# Patient Record
Sex: Female | Born: 1995
Health system: Southern US, Community
[De-identification: ages and names within clinical notes are randomized; demographics above are authoritative.]

## PROBLEM LIST (undated history)

## (undated) DIAGNOSIS — R011 Cardiac murmur, unspecified: Secondary | ICD-10-CM

## (undated) DIAGNOSIS — F419 Anxiety disorder, unspecified: Secondary | ICD-10-CM

## (undated) HISTORY — DX: Anxiety disorder, unspecified: F41.9

## (undated) HISTORY — DX: Cardiac murmur, unspecified: R01.1

---
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
## 2013-09-16 ENCOUNTER — Encounter (HOSPITAL_COMMUNITY): Payer: Self-pay | Admitting: Emergency Medicine

## 2013-09-16 ENCOUNTER — Emergency Department (INDEPENDENT_AMBULATORY_CARE_PROVIDER_SITE_OTHER)
Admission: EM | Admit: 2013-09-16 | Discharge: 2013-09-16 | Disposition: A | Discharge status: Home / Self Care | Payer: Self-pay | Source: Home / Self Care | Attending: Family Medicine | Admitting: Family Medicine

## 2013-09-16 DIAGNOSIS — L259 Unspecified contact dermatitis, unspecified cause: Secondary | ICD-10-CM

## 2013-09-16 MED ORDER — PREDNISONE 5 MG PO KIT: PACK | ORAL | Status: DC

## 2013-09-16 MED ORDER — TRIAMCINOLONE ACETONIDE 0.1 % EX CREA: 1.0000 "application " | TOPICAL_CREAM | Freq: Two times a day (BID) | CUTANEOUS | Status: DC

## 2013-09-16 NOTE — Discharge Instructions (Signed)
Thank you for coming in today. Take prednisone as directed. Use triamcinolone cream for the very bad spots. Use Gold Bond Itch as needed. Take Benadryl as needed at bedtime.

## 2013-09-16 NOTE — ED Provider Notes (Signed)
Charlene Reed is a 18 y.o. female who presents to Urgent Care today for rash on trunk and extremities for the past 2 days. The rash has spread spontaneously. It is itchy. She denies any fevers chills nausea vomiting diarrhea. She denies any cutaneous involvement. She denies any new medications. She's tried over-the-counter hydrocortisone cream which has not helped much. She feels well otherwise. She denies any new soaps conditioners detergents etc.   History reviewed. No pertinent past medical history. History  Substance Use Topics  . Smoking status: Never Smoker   . Smokeless tobacco: Not on file  . Alcohol Use: No   ROS as above Medications: No current facility-administered medications for this encounter.   Current Outpatient Prescriptions  Medication Sig Dispense Refill  . PredniSONE 5 MG KIT 12 day dose pack po  1 kit  0  . triamcinolone cream (KENALOG) 0.1 % Apply 1 application topically 2 (two) times daily.  60 g  1    Exam:  BP 126/58  Pulse 62  Temp(Src) 98.1 F (36.7 C) (Oral)  Resp 18  SpO2 100%  LMP 08/15/2013 Gen: Well NAD HEENT: EOMI,  MMM no mucocutaneous involvement Lungs: Normal work of breathing. CTABL Heart: RRR no MRG Abd: NABS, Soft. NT, ND Exts: Brisk capillary refill, warm and well perfused.  Skin: Scattered maculopapular erythema across trunk and extremities. Confluent 1 cm diameter circles  Assessment and Plan: 18 y.o. female with contact dermatitis most likely explanation. Plan to treat with prednisone Dosepak, triamcinolone cream and Gold Bond Itch. Additionally recommend Benadryl. Followup if not improving.  Discussed warning signs or symptoms. Please see discharge instructions. Patient expresses understanding.    Gregor Hams, MD 09/16/13 [phone]

## 2013-09-16 NOTE — ED Notes (Signed)
C/o rash all over body since yesterday Admit the rash does itch States she has never had this reaction before Has put lotion on skin States rash started off small

## 2014-02-11 IMAGING — CR DG CHEST 2V
2 series · 2 of 2 positions shown · non-contrast
Comparison: None.

CLINICAL DATA: Vertigo with syncope.

CHEST - 2 VIEW

[view not recorded (1 of 2)]
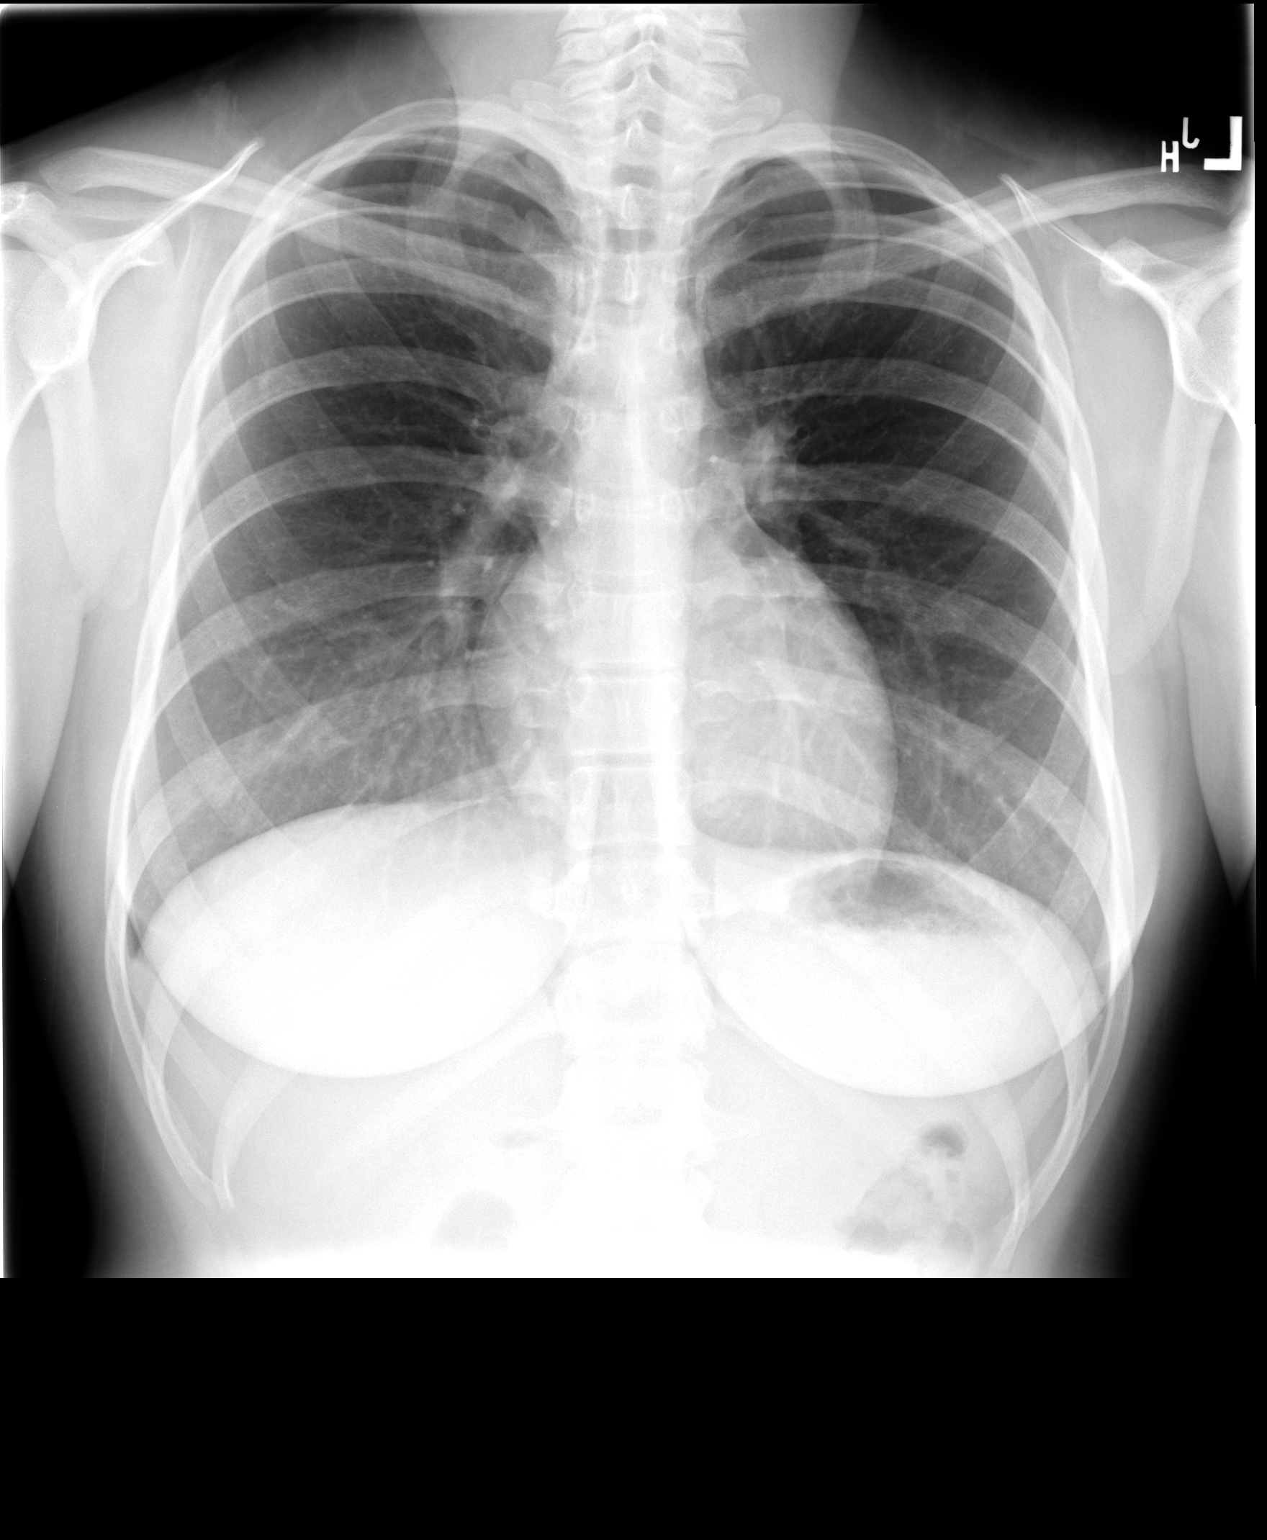

[view not recorded (2 of 2)]
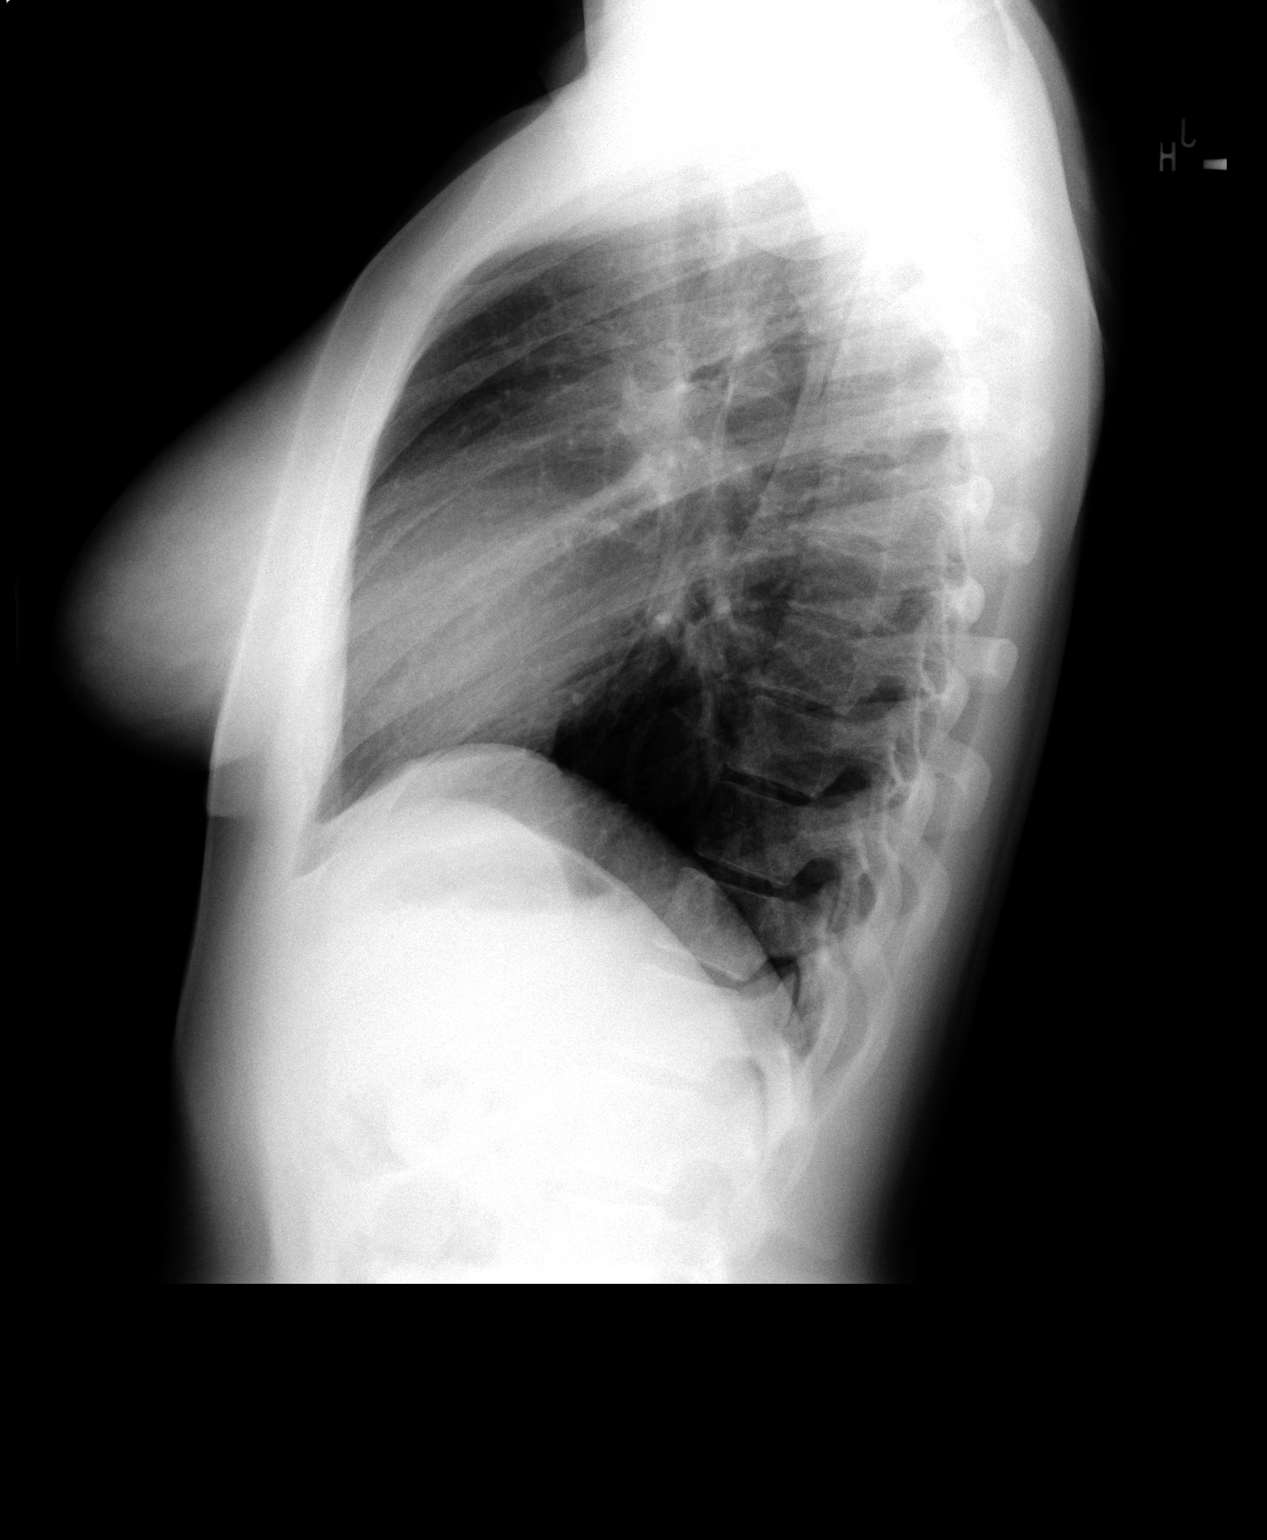

[2 of 2 positions shown; findings below may reference images not displayed]

FINDINGS: Midline trachea.  Normal heart size and mediastinal
contours. No pleural effusion or pneumothorax.  Clear lungs.
IMPRESSION: Normal chest.

## 2016-08-09 LAB — HM PAP SMEAR: HM PAP: NEGATIVE

## 2018-05-25 ENCOUNTER — Ambulatory Visit: Payer: Self-pay | Admitting: Osteopathic Medicine

## 2018-06-11 ENCOUNTER — Ambulatory Visit (INDEPENDENT_AMBULATORY_CARE_PROVIDER_SITE_OTHER): Payer: No Typology Code available for payment source | Admitting: Osteopathic Medicine

## 2018-06-11 ENCOUNTER — Encounter: Payer: Self-pay | Admitting: Osteopathic Medicine

## 2018-06-11 DIAGNOSIS — Z113 Encounter for screening for infections with a predominantly sexual mode of transmission: Secondary | ICD-10-CM

## 2018-06-11 DIAGNOSIS — Z30011 Encounter for initial prescription of contraceptive pills: Secondary | ICD-10-CM | POA: Diagnosis not present

## 2018-06-11 DIAGNOSIS — L700 Acne vulgaris: Secondary | ICD-10-CM | POA: Diagnosis not present

## 2018-06-11 DIAGNOSIS — F418 Other specified anxiety disorders: Secondary | ICD-10-CM

## 2018-06-11 LAB — POCT URINE PREGNANCY: Preg Test, Ur: NEGATIVE

## 2018-06-11 MED ORDER — ESCITALOPRAM OXALATE 5 MG PO TABS: 5.0000 mg | ORAL_TABLET | Freq: Every day | ORAL | 0 refills | Status: DC

## 2018-06-11 MED ORDER — ETONOGESTREL-ETHINYL ESTRADIOL 0.12-0.015 MG/24HR VA RING: VAGINAL_RING | VAGINAL | 3 refills | Status: DC

## 2018-06-11 MED FILL — ESCITALOPRAM 5 MG TABLET: 5 | 90 days supply | Qty: 90 | Fill #0

## 2018-06-11 NOTE — Progress Notes (Signed)
HPI: Charlene Reed is a 22 y.o. female who  has no past medical history on file.  she presents to [facility] today, 06/11/18,  for chief complaint of: New to establish care Contraception Anxiety Needs paperwork for school  Very pleasant new patient here to establish care.  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  Has some questions about medications/management of anxiety.  She frequently feels overwhelmed by her work duties, some difficulty sleeping on occasion.  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  Previously on oral contraceptives for birth control as well as acne.  Is interested in getting back on pills but also has some questions about the NuvaRing.  Needs some paperwork filled out in the next couple of weeks to verify immunization history, TB screening, hemoglobin.  Does not have lab results with her, she states she thinks employee health had some blood work done not too long ago, unfortunately we cannot see these results in epic.  Previously seeing a dermatologist for acne management and would like a referral to someone in network since she has changed insurance   Past medical, surgical, social and family history reviewed:  Patient Active Problem List   Diagnosis Date Noted  . Acne vulgaris 06/11/2018   History reviewed. No pertinent surgical history.  Social History   Tobacco Use  . Smoking status: Current Every Day Smoker  . Smokeless tobacco: Never Used  Substance Use Topics  . Alcohol use: No    Family History  Problem Relation Age of Onset  . Heart attack Father   . High blood pressure Father   . Breast cancer Maternal Grandmother      Current medication list and allergy/intolerance information reviewed:   No Known Allergies    Review of Systems:  Constitutional:  No  fever, no chills, No recent illness, No unintentional weight  changes. No significant fatigue.   HEENT: No  headache, no vision change, no hearing change, No sore throat, No  sinus pressure  Cardiac: No  chest pain, No  pressure, No palpitations, No  Orthopnea  Respiratory:  No  shortness of breath. No  Cough  Gastrointestinal: No  abdominal pain, No  nausea, No  vomiting,  No  blood in stool, No  diarrhea, No  constipation   Musculoskeletal: No new myalgia/arthralgia  Skin: No  Rash, No other wounds/concerning lesions  Genitourinary: No  incontinence, No  abnormal genital bleeding, No abnormal genital discharge  Hem/Onc: No  easy bruising/bleeding, No  abnormal lymph node  Endocrine: No cold intolerance,  No heat intolerance. No polyuria/polydipsia/polyphagia   Neurologic: No  weakness, No  dizziness, No  slurred speech/focal weakness/facial droop  Psychiatric: +concerns with depression, +concerns with anxiety, No sleep problems, No mood problems  Exam:  BP 125/74 (BP Location: Left Arm, Patient Position: Sitting, Cuff Size: Normal)   Pulse 74   Temp 98.4 F (36.9 C) (Oral)   Wt 141 lb 3.2 oz (64 kg)   Constitutional: VS see above. General Appearance: alert, well-developed, well-nourished, NAD  Eyes: Normal lids and conjunctive, non-icteric sclera  Ears, Nose, Mouth, Throat: MMM, Normal external inspection ears/nares/mouth/lips/gums. TM normal bilaterally. Pharynx/tonsils no erythema, no exudate. Nasal mucosa normal.   Neck: No masses, trachea midline. No thyroid enlargement. No tenderness/mass appreciated. No lymphadenopathy  Respiratory: Normal respiratory effort. no wheeze, no rhonchi, no rales  Cardiovascular: S1/S2 normal, no murmur, no rub/gallop  auscultated. RRR.   Gastrointestinal: Nontender, no masses. No hepatomegaly, no splenomegaly. No hernia appreciated. Bowel sounds normal. Rectal exam deferred.   Musculoskeletal: Gait normal. No clubbing/cyanosis of digits.   Neurological: Normal balance/coordination. No tremor.    Skin: warm, dry, intact. No rash/ulcer. No concerning nevi or subq nodules on limited exam.    Psychiatric: Normal judgment/insight. Normal mood and affect. Oriented x3.     ASSESSMENT/PLAN:   Mixed anxiety depressive disorder - Plan: CBC, QuantiFERON-TB Gold Plus, TSH, COMPLETE METABOLIC PANEL WITH GFR  Routine screening for STI (sexually transmitted infection) - Plan: C. trachomatis/N. gonorrhoeae RNA, Trichomonas vaginalis, RNA, POCT Pregnancy, Urine, CBC, QuantiFERON-TB Gold Plus, TSH, COMPLETE METABOLIC PANEL WITH GFR, Hepatitis C antibody, reflex, RPR, HIV Antibody (routine testing w rflx)  Encounter for initial prescription of contraceptive pills - Plan: C. trachomatis/N. gonorrhoeae RNA, Trichomonas vaginalis, RNA, POCT Pregnancy, Urine, CBC, QuantiFERON-TB Gold Plus, TSH, COMPLETE METABOLIC PANEL WITH GFR  Acne vulgaris - Plan: Ambulatory referral to Dermatology    We will see if we can get lab records from employee health to complete paperwork, if not can reorder.  [employer] is down at the moment, will try again Monday.  It was given originals and we have a copy of the paperwork that she needs to have filled out.  Visit summary with medication list and pertinent instructions was printed for patient to review. All questions at time of visit were answered - patient instructed to contact office with any additional concerns. ER/RTC precautions were reviewed with the patient.   Follow-up plan: Return in about 6 weeks (around 07/23/2018) for moos recheck +/- annual wellness physical .    Please note: voice recognition software was used to produce this document, and typos may escape review. Please contact Dr. Lyn Hollingshead for any needed clarifications.

## 2018-06-12 LAB — C. TRACHOMATIS/N. GONORRHOEAE RNA
C. TRACHOMATIS RNA, TMA: NOT DETECTED
N. gonorrhoeae RNA, TMA: NOT DETECTED

## 2018-06-13 LAB — QUANTIFERON-TB GOLD PLUS
Mitogen-NIL: >10 IU/mL
NIL: 0.07 IU/mL
QuantiFERON-TB Gold Plus: NEGATIVE
TB2-NIL: <0 IU/mL

## 2018-06-13 LAB — COMPLETE METABOLIC PANEL WITH GFR
AG Ratio: 1.8 (calc) (ref 1.0–2.5)
ALKALINE PHOSPHATASE (APISO): 55 U/L (ref 33–115)
ALT: 10 U/L (ref 6–29)
AST: 16 U/L (ref 10–30)
Albumin: 4.5 g/dL (ref 3.6–5.1)
BUN: 8 mg/dL (ref 7–25)
CO2: 27 mmol/L (ref 20–32)
CREATININE: 0.77 mg/dL (ref 0.50–1.10)
Calcium: 10 mg/dL (ref 8.6–10.2)
Chloride: 105 mmol/L (ref 98–110)
GFR, Est African American: 125 mL/min/{1.73_m2} (ref >=60)
GFR, Est Non African American: 108 mL/min/{1.73_m2} (ref >=60)
GLUCOSE: 86 mg/dL (ref 65–99)
Globulin: 2.5 g/dL (calc) (ref 1.9–3.7)
POTASSIUM: 4.2 mmol/L (ref 3.5–5.3)
Sodium: 139 mmol/L (ref 135–146)
Total Bilirubin: 0.3 mg/dL (ref 0.2–1.2)
Total Protein: 7 g/dL (ref 6.1–8.1)

## 2018-06-13 LAB — CBC
HCT: 41.3 % (ref 35.0–45.0)
Hemoglobin: 14 g/dL (ref 11.7–15.5)
MCH: 30.2 pg (ref 27.0–33.0)
MCHC: 33.9 g/dL (ref 32.0–36.0)
MCV: 89 fL (ref 80.0–100.0)
MPV: 10.7 fL (ref 7.5–12.5)
PLATELETS: 355 10*3/uL (ref 140–400)
RBC: 4.64 10*6/uL (ref 3.80–5.10)
RDW: 12 % (ref 11.0–15.0)
WBC: 5.8 10*3/uL (ref 3.8–10.8)

## 2018-06-13 LAB — TSH: TSH: 1.62 mIU/L

## 2018-06-14 LAB — RPR: RPR Ser Ql: NONREACTIVE

## 2018-06-14 LAB — HIV ANTIBODY (ROUTINE TESTING W REFLEX): HIV 1&2 Ab, 4th Generation: NONREACTIVE

## 2018-06-17 ENCOUNTER — Encounter: Payer: Self-pay | Admitting: Osteopathic Medicine

## 2018-06-24 ENCOUNTER — Encounter: Payer: Self-pay | Admitting: Osteopathic Medicine

## 2018-07-02 MED FILL — NUVARING VAGINAL RING: 0.12-0.015 | 84 days supply | Qty: 3 | Fill #0

## 2018-07-23 ENCOUNTER — Encounter: Payer: No Typology Code available for payment source | Admitting: Osteopathic Medicine

## 2018-09-10 MED FILL — ETONOGESTREL-ETHINYL ESTRAD: 0.12-0.015 | 84 days supply | Qty: 3 | Fill #1

## 2018-11-09 ENCOUNTER — Telehealth: Payer: Self-pay

## 2018-11-09 NOTE — Telephone Encounter (Signed)
Charlene Reed called and left a message about birth control options. I called and left her a message to schedule an appointment.

## 2018-11-10 NOTE — Telephone Encounter (Signed)
Can talk about that at her physical If contraception needed in the interim, recommend condoms/abstinence

## 2018-11-10 NOTE — Telephone Encounter (Signed)
Pt has a physical scheduled on 11/15/18.Charlene Reed is this something you can discuss then or does it require a seperate appt?

## 2018-11-10 NOTE — Telephone Encounter (Signed)
Left pt msg advising medication needs can be addressed at physical.

## 2018-11-15 ENCOUNTER — Encounter: Payer: No Typology Code available for payment source | Admitting: Osteopathic Medicine

## 2018-11-16 MED FILL — ETONOGESTREL-ETHINYL ESTRAD: 0.12-0.015 | 84 days supply | Qty: 3 | Fill #2

## 2018-11-16 MED FILL — FLUCONAZOLE 150 MG TABS: 150 | 5 days supply | Qty: 2 | Fill #0

## 2018-11-29 ENCOUNTER — Telehealth: Payer: Self-pay

## 2018-11-29 NOTE — Telephone Encounter (Signed)
Charlene Reed called and left a message stating she will need an appointment for fever and cough. I called and left a message stating patient will need to call back to schedule appointment. She will also need to reach out to [facility] - [phone].

## 2019-01-25 ENCOUNTER — Encounter: Payer: No Typology Code available for payment source | Admitting: Osteopathic Medicine

## 2019-04-19 ENCOUNTER — Other Ambulatory Visit: Payer: Self-pay | Admitting: Osteopathic Medicine

## 2019-04-19 MED FILL — ETONOGESTREL-ETHINYL ESTRAD: 0.12-0.015 | 84 days supply | Qty: 3 | Fill #0

## 2019-04-26 ENCOUNTER — Encounter: Payer: Self-pay | Admitting: Osteopathic Medicine

## 2019-04-26 ENCOUNTER — Other Ambulatory Visit: Payer: Self-pay

## 2019-04-26 ENCOUNTER — Ambulatory Visit (INDEPENDENT_AMBULATORY_CARE_PROVIDER_SITE_OTHER): Payer: No Typology Code available for payment source | Admitting: Osteopathic Medicine

## 2019-04-26 VITALS — BP 104/70 | HR 61 | Temp 98.1°F | Wt 145.4 lb

## 2019-04-26 DIAGNOSIS — Z0000 Encounter for general adult medical examination without abnormal findings: Secondary | ICD-10-CM

## 2019-04-26 DIAGNOSIS — Q278 Other specified congenital malformations of peripheral vascular system: Secondary | ICD-10-CM

## 2019-04-26 MED ORDER — ETONOGESTREL-ETHINYL ESTRADIOL 0.12-0.015 MG/24HR VA RING: VAGINAL_RING | VAGINAL | 4 refills | Status: DC

## 2019-04-26 NOTE — Progress Notes (Signed)
HPI: Charlene Reed is a 23 y.o. female who  has no past medical history on file.  she presents to [facility] today, 04/26/19,  for chief complaint of: Annual Physical     Patient here for annual physical / wellness exam.  See preventive care reviewed as below.    Additional concerns today include:  Requests referral to vein specialist.  She reports some discoloration and bulging veins in lower extremities especially when on her feet all day.  She is interested in possible cosmetic solutions to this.    Past medical, surgical, social and family history reviewed:  Patient Active Problem List   Diagnosis Date Noted  . Acne vulgaris 06/11/2018    History reviewed. No pertinent surgical history.  Social History   Tobacco Use  . Smoking status: Never Smoker  . Smokeless tobacco: Never Used  Substance Use Topics  . Alcohol use: No    Family History  Problem Relation Age of Onset  . Heart attack Father   . High blood pressure Father   . Breast cancer Maternal Grandmother      Current medication list and allergy/intolerance information reviewed:    Current Outpatient Medications  Medication Sig Dispense Refill  . etonogestrel-ethinyl estradiol (NUVARING) 0.12-0.015 MG/24HR vaginal ring INSERT 1 RING VAGINALLY AND LEAVE IN PLACE FOR 3 WEEKS THEN REMOVE FOR 1 WEEK AND PLACE NEW RING 3 each 4   No current facility-administered medications for this visit.     No Known Allergies    Review of Systems:  Constitutional:  No  fever, no chills, No recent illness, No unintentional weight changes. No significant fatigue.   HEENT: No  headache, no vision change, no hearing change, No sore throat, No  sinus pressure  Cardiac: No  chest pain, No  pressure, No palpitations, No  Orthopnea  Respiratory:  No  shortness of breath. No  Cough  Gastrointestinal: No  abdominal pain, No  nausea, No  vomiting,  No  blood in stool, No  diarrhea, No   constipation   Musculoskeletal: No new myalgia/arthralgia  Skin: No  Rash, No other wounds/concerning lesions  Genitourinary: No  incontinence, No  abnormal genital bleeding, No abnormal genital discharge  Hem/Onc: No  easy bruising/bleeding, No  abnormal lymph node  Endocrine: No cold intolerance,  No heat intolerance. No polyuria/polydipsia/polyphagia   Neurologic: No  weakness, No  dizziness, No  slurred speech/focal weakness/facial droop  Psychiatric: No  concerns with depression, No  concerns with anxiety, No sleep problems, No mood problems  Exam:  BP 104/70 (BP Location: Left Arm, Patient Position: Sitting, Cuff Size: Normal)   Pulse 61   Temp 98.1 F (36.7 C) (Oral)   Wt 145 lb 6.4 oz (66 kg)   Constitutional: VS see above. General Appearance: alert, well-developed, well-nourished, NAD  Eyes: Normal lids and conjunctive, non-icteric sclera  Ears, Nose, Mouth, Throat: MMM, Normal external inspection ears/nares/mouth/lips/gums. TM normal bilaterally. Pharynx/tonsils no erythema, no exudate. Nasal mucosa normal.   Neck: No masses, trachea midline. No thyroid enlargement. No tenderness/mass appreciated. No lymphadenopathy  Respiratory: Normal respiratory effort. no wheeze, no rhonchi, no rales  Cardiovascular: S1/S2 normal, no murmur, no rub/gallop auscultated. RRR. No lower extremity edema. Veins appear prominent but normal   Gastrointestinal: Nontender, no masses. No hepatomegaly, no splenomegaly. No hernia appreciated. Bowel sounds normal. Rectal exam deferred.   Musculoskeletal: Gait normal. No clubbing/cyanosis of digits.   Neurological: Normal balance/coordination. No tremor. No cranial nerve deficit on  limited exam. Motor and sensation intact and symmetric. Cerebellar reflexes intact.   Skin: warm, dry, intact. No rash/ulcer. No concerning nevi or subq nodules on limited exam.    Psychiatric: Normal judgment/insight. Normal mood and affect. Oriented x3.    No  results found for this or any previous visit (from the past 72 hour(s)).  No results found.   ASSESSMENT/PLAN: The primary encounter diagnosis was Annual physical exam. A diagnosis of Abnormality of systemic vein was also pertinent to this visit.   Orders Placed This Encounter  Procedures  . Ambulatory referral to Vascular Surgery    Meds ordered this encounter  Medications  . etonogestrel-ethinyl estradiol (NUVARING) 0.12-0.015 MG/24HR vaginal ring    Sig: INSERT 1 RING VAGINALLY AND LEAVE IN PLACE FOR 3 WEEKS THEN REMOVE FOR 1 WEEK AND PLACE NEW RING    Dispense:  3 each    Refill:  4    Patient Instructions   General Preventive Care  Most recent routine screening lipids/other labs: every other year is fine for people under 35 who are otherwise healthy. We should check cholesterol every 2 years given family history.   Everyone should have blood pressure checked once per year.   Tobacco: don't!   Alcohol: responsible moderation is ok for most adults - if you have concerns about your alcohol intake, please talk to me!   Exercise: as tolerated to reduce risk of cardiovascular disease and diabetes. Strength training will also prevent osteoporosis.   Mental health: if need for mental health care (medicines, counseling, other), or concerns about moods, please let me know!   Sexual health: if need for STD testing, or if concerns with libido/pain problems, please let me know! If you need to discuss your birth control options, please let me know!   Advanced Directive: Living Will and/or Healthcare Power of Attorney recommended for all adults, regardless of age or health.  Vaccines  Flu vaccine: recommended for almost everyone, every fall.   Tetanus booster: Tdap recommended every 10 years. Due 2027  HPV vaccine: done!   See below for vaccine records we have on file  Cancer screenings   Colon cancer screening: recommended for everyone at age 69, but some folks need a  colonoscopy sooner if risk factors   Breast cancer screening: mammogram recommended at age 24  Cervical cancer screening: Pap due 08/2019   Lung cancer screening: not needed for non-smokers  Infection screenings . HIV, Gonorrhea/Chlamydia, other STI: screening as needed . TB: certain at-risk populations, or depending on work requirements and/or travel history Other . Bone Density Test: recommended for women at age 20             Immunization History  Administered Date(s) Administered  . DTaP 05/12/1994, 07/17/1994, 09/18/1994, 09/24/1995, 03/27/1998  . HPV Quadrivalent 03/11/2006, 05/13/2006, 08/03/2007  . Hepatitis A, Ped/Adol-2 Dose 03/11/2006, 08/03/2007  . Hepatitis B, ped/adol 05/12/1994, 07/17/1994, 02/26/1995  . HiB (PRP-OMP) 05/12/1994, 07/17/1994, 09/18/1994, 09/24/1995  . IPV 05/12/1994, 07/17/1994, 09/18/1994, 03/27/1998  . Influenza, Seasonal, Injecte, Preservative Fre 10/07/2012, 06/06/2016, 06/16/2017  . Influenza-Unspecified 05/02/2018  . MMR 05/21/1995, 03/27/1998  . Meningococcal Conjugate 10/07/2012  . PPD Test 09/30/2016  . Tdap 03/11/2006, 01/11/2016          Visit summary with medication list and pertinent instructions was printed for patient to review. All questions at time of visit were answered - patient instructed to contact office with any additional concerns or updates. ER/RTC precautions were reviewed with the patient.  Please note: voice recognition software was used to produce this document, and typos may escape review. Please contact Dr. Sheppard Coil for any needed clarifications.     Follow-up plan: Return for PAP ONLY 08/2019. Marland Kitchen

## 2019-04-26 NOTE — Patient Instructions (Signed)
General Preventive Care  Most recent routine screening lipids/other labs: every other year is fine for people under 35 who are otherwise healthy. We should check cholesterol every 2 years given family history.   Everyone should have blood pressure checked once per year.   Tobacco: don't!   Alcohol: responsible moderation is ok for most adults - if you have concerns about your alcohol intake, please talk to me!   Exercise: as tolerated to reduce risk of cardiovascular disease and diabetes. Strength training will also prevent osteoporosis.   Mental health: if need for mental health care (medicines, counseling, other), or concerns about moods, please let me know!   Sexual health: if need for STD testing, or if concerns with libido/pain problems, please let me know! If you need to discuss your birth control options, please let me know!   Advanced Directive: Living Will and/or Healthcare Power of Attorney recommended for all adults, regardless of age or health.  Vaccines  Flu vaccine: recommended for almost everyone, every fall.   Tetanus booster: Tdap recommended every 10 years. Due 2027  HPV vaccine: done!   See below for vaccine records we have on file  Cancer screenings   Colon cancer screening: recommended for everyone at age 24, but some folks need a colonoscopy sooner if risk factors   Breast cancer screening: mammogram recommended at age 13  Cervical cancer screening: Pap due 08/2019   Lung cancer screening: not needed for non-smokers  Infection screenings . HIV, Gonorrhea/Chlamydia, other STI: screening as needed . TB: certain at-risk populations, or depending on work requirements and/or travel history Other . Bone Density Test: recommended for women at age 62             Immunization History  Administered Date(s) Administered  . DTaP 05/12/1994, 07/17/1994, 09/18/1994, 09/24/1995, 03/27/1998  . HPV Quadrivalent 03/11/2006, 05/13/2006, 08/03/2007  .  Hepatitis A, Ped/Adol-2 Dose 03/11/2006, 08/03/2007  . Hepatitis B, ped/adol 05/12/1994, 07/17/1994, 02/26/1995  . HiB (PRP-OMP) 05/12/1994, 07/17/1994, 09/18/1994, 09/24/1995  . IPV 05/12/1994, 07/17/1994, 09/18/1994, 03/27/1998  . Influenza, Seasonal, Injecte, Preservative Fre 10/07/2012, 06/06/2016, 06/16/2017  . Influenza-Unspecified 05/02/2018  . MMR 05/21/1995, 03/27/1998  . Meningococcal Conjugate 10/07/2012  . PPD Test 09/30/2016  . Tdap 03/11/2006, 01/11/2016

## 2019-06-29 ENCOUNTER — Other Ambulatory Visit: Payer: Self-pay

## 2019-06-29 DIAGNOSIS — I83899 Varicose veins of unspecified lower extremities with other complications: Secondary | ICD-10-CM

## 2019-07-07 ENCOUNTER — Encounter: Payer: No Typology Code available for payment source | Admitting: Vascular Surgery

## 2019-07-07 ENCOUNTER — Encounter (HOSPITAL_COMMUNITY): Payer: No Typology Code available for payment source

## 2019-08-31 ENCOUNTER — Ambulatory Visit: Payer: No Typology Code available for payment source | Admitting: Osteopathic Medicine

## 2019-10-04 ENCOUNTER — Encounter: Payer: No Typology Code available for payment source | Admitting: Vascular Surgery

## 2019-10-04 ENCOUNTER — Encounter (HOSPITAL_COMMUNITY): Payer: No Typology Code available for payment source

## 2019-10-20 ENCOUNTER — Encounter: Payer: No Typology Code available for payment source | Admitting: Vascular Surgery

## 2019-10-20 ENCOUNTER — Encounter (HOSPITAL_COMMUNITY): Payer: No Typology Code available for payment source

## 2019-12-06 ENCOUNTER — Ambulatory Visit: Payer: No Typology Code available for payment source | Admitting: Osteopathic Medicine

## 2019-12-06 ENCOUNTER — Telehealth: Payer: Self-pay | Admitting: Osteopathic Medicine

## 2019-12-06 NOTE — Telephone Encounter (Signed)
NO SHOW 08/31/19 for Pap w/ NMA NO SHOW 12/06/19 for Pap w/ NMA  Please call patient: she did not show to her appointment today. This is her second no-show in the past year. Please call to make sure everything is ok and see if she would like to reschedule. Please remind her of clinic policy that 3 no-shows in 1 year will result in dismissal from the practice, to protect access to appointments for all our patients.

## 2019-12-06 NOTE — Telephone Encounter (Signed)
Left patient a voicemail with information below. Let patient know to call us back with any questions.  

## 2020-07-09 ENCOUNTER — Telehealth: Payer: Self-pay | Admitting: Osteopathic Medicine

## 2020-07-09 ENCOUNTER — Ambulatory Visit: Payer: No Typology Code available for payment source | Admitting: Osteopathic Medicine

## 2020-07-09 NOTE — Telephone Encounter (Signed)
If you place the dismissal letter in [facility] mailbox in triage, she will send it certified mail for you. Thank you

## 2020-07-09 NOTE — Telephone Encounter (Signed)
Dismissal letter sent FYI to practice admin, clinical/front team leads

## 2020-07-24 NOTE — Telephone Encounter (Signed)
Yes I'm aware, I just need it flagged in the system and I can't do that Thanks!

## 2021-08-14 ENCOUNTER — Ambulatory Visit: Payer: Self-pay

## 2021-09-08 ENCOUNTER — Ambulatory Visit: Payer: Self-pay

## 2022-01-09 ENCOUNTER — Encounter: Payer: Self-pay | Admitting: Family Medicine

## 2022-01-09 ENCOUNTER — Other Ambulatory Visit (HOSPITAL_COMMUNITY)
Admission: RE | Admit: 2022-01-09 | Discharge: 2022-01-09 | Disposition: A | Discharge status: Home / Self Care | Payer: No Typology Code available for payment source | Source: Ambulatory Visit | Attending: Family Medicine | Admitting: Family Medicine

## 2022-01-09 ENCOUNTER — Ambulatory Visit (INDEPENDENT_AMBULATORY_CARE_PROVIDER_SITE_OTHER): Payer: No Typology Code available for payment source | Admitting: Family Medicine

## 2022-01-09 VITALS — BP 111/78 | HR 70 | Ht 60.0 in | Wt 151.2 lb

## 2022-01-09 DIAGNOSIS — Z1159 Encounter for screening for other viral diseases: Secondary | ICD-10-CM | POA: Diagnosis not present

## 2022-01-09 DIAGNOSIS — Z124 Encounter for screening for malignant neoplasm of cervix: Secondary | ICD-10-CM | POA: Diagnosis not present

## 2022-01-09 DIAGNOSIS — A749 Chlamydial infection, unspecified: Secondary | ICD-10-CM

## 2022-01-09 DIAGNOSIS — R87612 Low grade squamous intraepithelial lesion on cytologic smear of cervix (LGSIL): Secondary | ICD-10-CM

## 2022-01-09 DIAGNOSIS — Z113 Encounter for screening for infections with a predominantly sexual mode of transmission: Secondary | ICD-10-CM

## 2022-01-09 DIAGNOSIS — Z0000 Encounter for general adult medical examination without abnormal findings: Secondary | ICD-10-CM

## 2022-01-09 NOTE — Progress Notes (Signed)
? ?Complete physical exam ? ?Patient: Charlene Reed   DOB: 1996-04-19   26 y.o. Female  MRN: [id number] ? ?Subjective:  ?  ?CC: establish care, no concerns, CPE today  ? ? ?Charlene Reed is a 26 y.o. female who presents today for a complete physical exam. She reports consuming a  general, healthy  diet. Home exercise routine includes cardio 20 minutes 5x/week, weight lifting . She generally feels well. She reports sleeping well. She does not have additional problems to discuss today.  ? 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 ? ? ? ?Most recent fall risk assessment: ? ?  01/09/2022  ?  1:56 PM  ?Fall Risk   ?Falls in the past year? 0  ?Number falls in past yr: 0  ?Injury with Fall? 0  ? ?  ?Most recent depression screenings: ? ?  01/09/2022  ?  1:57 PM 04/26/2019  ?  9:37 AM  ?PHQ 2/9 Scores  ?PHQ - 2 Score 0 0  ?PHQ- 9 Score  3  ? ? ?Vision:Not within last year , Dental: No current dental problems and No regular dental care , and STD: no concerns, but would like routine screening. Scheduled for upcoming eye doctor and dental exams.  ? ?Patient Active Problem List  ? Diagnosis Date Noted  ? Acne vulgaris 06/11/2018  ? ?History reviewed. No pertinent past medical history. ?No Known Allergies ?  ? ?Patient Care Team: ?Terrilyn Saver, NP as PCP - General (Family Medicine)  ? ?Outpatient Medications Prior to Visit  ?Medication Sig  ? Multiple Vitamin (MULTIVITAMIN) tablet Take 1 tablet by mouth daily.  ? [DISCONTINUED] etonogestrel-ethinyl estradiol (NUVARING) 0.12-0.015 MG/24HR vaginal ring INSERT 1 RING VAGINALLY AND LEAVE IN PLACE FOR 3 WEEKS THEN REMOVE FOR 1 WEEK AND PLACE NEW RING  ? ?No facility-administered medications prior to visit.  ? ? ?ROS ?All review of systems negative except what is listed in the HPI ? ? ? ?   ?Objective:  ? ?  ?BP 111/78   Pulse 70   Ht 5' (1.524 m)   Wt 151 lb 3.2 oz (68.6 kg)   BMI 29.53 kg/m?   ? ? ?Physical Exam ?Vitals reviewed.  ?Constitutional:   ?   Appearance: Normal appearance.  ?HENT:  ?   Reed: Normocephalic and atraumatic.  ?   Right Ear: Tympanic membrane normal.  ?   Left Ear: Tympanic membrane normal.  ?   Nose: Nose normal.  ?   Mouth/Throat:  ?   Mouth: Mucous membranes are moist.  ?   Pharynx: Oropharynx is clear.  ?Eyes:  ?   Extraocular Movements: Extraocular movements intact.  ?   Conjunctiva/sclera: Conjunctivae normal.  ?   Pupils: Pupils are equal, round, and reactive to light.  ?Cardiovascular:  ?   Rate and Rhythm: Normal rate and regular rhythm.  ?   Pulses: Normal pulses.  ?   Heart sounds: Normal heart sounds.  ?Pulmonary:  ?   Effort: Pulmonary effort is normal.  ?   Breath sounds: Normal breath sounds.  ?Abdominal:  ?   General: Abdomen is flat. Bowel sounds are normal.  ?   Palpations: Abdomen is soft.  ?Genitourinary: ?   General: Normal vulva.  ?   Comments: Scant white, milky discharge ?Musculoskeletal:     ?   General: Normal range of motion.  ?  Cervical back: Normal range of motion and neck supple. No tenderness.  ?Lymphadenopathy:  ?   Cervical: No cervical adenopathy.  ?Skin: ?   General: Skin is warm and dry.  ?   Capillary Refill: Capillary refill takes less than 2 seconds.  ?Neurological:  ?   General: No focal deficit present.  ?   Mental Status: She is alert and oriented to person, place, and time. Mental status is at baseline.  ?Psychiatric:     ?   Mood and Affect: Mood normal.     ?   Behavior: Behavior normal.     ?   Thought Content: Thought content normal.     ?   Judgment: Judgment normal.  ?  ? ? ? ?No results found for any visits on 01/09/22. ? ?   ?Assessment & Plan:  ?  ?Routine Health Maintenance and Physical Exam ? ?Immunization History  ?Administered Date(s) Administered  ? DTaP 05/12/1994, 07/17/1994, 09/18/1994, 09/24/1995, 03/27/1998  ? HPV Quadrivalent 03/11/2006, 05/13/2006, 08/03/2007  ? Hepatitis A, Ped/Adol-2 Dose 03/11/2006, 08/03/2007  ?  Hepatitis B, ped/adol 05/12/1994, 07/17/1994, 02/26/1995  ? HiB (PRP-OMP) 05/12/1994, 07/17/1994, 09/18/1994, 09/24/1995  ? IPV 05/12/1994, 07/17/1994, 09/18/1994, 03/27/1998  ? Influenza, Seasonal, Injecte, Preservative Fre 10/07/2012, 06/06/2016, 06/16/2017  ? Influenza-Unspecified 05/02/2018  ? MMR 05/21/1995, 03/27/1998  ? Meningococcal Conjugate 10/07/2012  ? PPD Test 09/30/2016  ? Tdap 03/11/2006, 01/11/2016  ? ? ?Health Maintenance  ?Topic Date Due  ? Hepatitis C Screening  Never done  ? PAP-Cervical Cytology Screening  08/10/2019  ? PAP SMEAR-Modifier  08/10/2019  ? INFLUENZA VACCINE  04/01/2022  ? TETANUS/TDAP  01/10/2026  ? HPV VACCINES  Completed  ? HIV Screening  Completed  ? ? ?Discussed health benefits of physical activity, and encouraged her to engage in regular exercise appropriate for her age and condition. ? ?Problem List Items Addressed This Visit   ?None ?Visit Diagnoses   ? ? Annual physical exam    -  Primary  ? Relevant Orders  ? CBC  ? Comprehensive metabolic panel  ? Lipid panel  ? TSH  ? Hepatitis C antibody  ? Encounter for hepatitis C screening test for low risk patient      ? Relevant Orders  ? Hepatitis C antibody  ? Screening for cervical cancer      ? Relevant Orders  ? Cytology - PAP  ? Screen for STD (sexually transmitted disease)      ? Relevant Orders  ? Cervicovaginal ancillary only  ? HIV Antibody (routine testing w rflx)  ? RPR  ? ?  ? ?Return in about 1 year (around 01/10/2023) for physical . ? ?  ? ?Terrilyn Saver, NP ? ? ?

## 2022-01-09 NOTE — Patient Instructions (Signed)
Thank you for choosing [facility] for your Primary Care needs. I am excited for the opportunity to partner with you to meet your health care goals. It was a pleasure meeting you today! ? ? ?Information on diet, exercise, and health maintenance recommendations are listed below. This is information to help you be sure you are on track for optimal health and monitoring.  ? ?Please look over this and let us know if you have any questions or if you have completed any of the health maintenance outside of [place] so that we can be sure your records are up to date.  ?___________________________________________________________ ? ?MyChart:  ?For all urgent or time sensitive needs we ask that you please call the office to avoid delays. Our number is [phone]. ?MyChart is not constantly monitored and due to the large volume of messages a day, replies may take up to 72 business hours. ? ?MyChart Policy: ?MyChart allows for you to see your visit notes, after visit summary, provider recommendations, lab and tests results, make an appointment, request refills, and contact your provider or the office for non-urgent questions or concerns. Providers are seeing patients during normal business hours and do not have built in time to review MyChart messages.  ?We ask that you allow a minimum of 3 business days for responses to MyChart messages. For this reason, please do not send urgent requests through MyChart. Please call the office at [phone]. ?New and ongoing conditions may require a visit. We have virtual and in-person visits available for your convenience.  ?Complex MyChart concerns may require a visit. Your provider may request you schedule a virtual or in-person visit to ensure we are providing the best care possible. ?MyChart messages sent after 11:00 AM on Friday will not be received by the provider until Monday morning.  ?  ?Lab and Test Results: ?You will receive your lab and  test results on MyChart as soon as they are completed and results have been sent by the lab or testing facility. Due to this service, you will receive your results BEFORE your provider.  ?I review lab and test results each morning prior to seeing patients. Some results require collaboration with other providers to ensure you are receiving the most appropriate care. For this reason, we ask that you please allow a minimum of 3-5 business days from the time that ALL results have been received for your provider to receive and review lab and test results and contact you about these.  ?Most lab and test result comments from the provider will be sent through MyChart. Your provider may recommend changes to the plan of care, follow-up visits, repeat testing, ask questions, or request an office visit to discuss these results. You may reply directly to this message or call the office to provide information for the provider or set up an appointment. ?In some instances, you will be called with test results and recommendations. Please let us know if this is preferred and we will make note of this in your chart to provide this for you.    ?If you have not heard a response to your lab or test results in 5 business days from all results returning to MyChart, please call the office to let us know. We ask that you please avoid calling prior to this time unless there is an emergent concern. Due to high call volumes, this can delay the resulting process. ? ?After Hours: ?For all non-emergency after hours needs,   please call the office at [phone] and select the option to reach the on-call  service. On-call services are shared between multiple [facility] offices and therefore it will not be possible to speak directly with your provider. On-call providers may provide medical advice and recommendations, but are unable to provide refills for maintenance medications.  ?For all emergency or urgent medical needs after normal business  hours, we recommend that you seek care at the closest Urgent Care or Emergency Department to ensure appropriate treatment in a timely manner.  ?[facility] has a 24 hour emergency room located on the ground floor for your convenience.  ? ?Urgent Concerns During the Business Day ?Providers are seeing patients from 8AM to 5PM with a busy schedule and are most often not able to respond to non-urgent calls until the end of the day or the next business day. ?If you should have URGENT concerns during the day, please call and speak to the nurse or schedule a same day appointment so that we can address your concern without delay.  ? ?Thank you, again, for choosing me as your health care partner. I appreciate your trust and look forward to learning more about you.  ? ?Jenine Krisher B. Nathanal Hermiz, DNP, FNP-C ? ?___________________________________________________________ ? ?Health Maintenance Recommendations ?Screening Testing ?Mammogram ?Every 1-2 years based on history and risk factors ?Starting at age 50 ?Pap Smear ?Ages 21-39 every 3 years ?Ages 30-65 every 5 years with HPV testing ?More frequent testing may be required based on results and history ?Colon Cancer Screening ?Every 1-10 years based on test performed, risk factors, and history ?Starting at age 45 ?Bone Density Screening ?Every 2-10 years based on history ?Starting at age 65 for women ?Recommendations for men differ based on medication usage, history, and risk factors ?AAA Screening ?One time ultrasound ?Men 65-75 years old who have ever smoked ?Lung Cancer Screening ?Low Dose Lung CT every 12 months ?Age 50-80 years with a 20 pack-year smoking history who still smoke or who have quit within the last 15 years ? ?Screening Labs ?Routine  Labs: Complete Blood Count (CBC), Complete Metabolic Panel (CMP), Cholesterol (Lipid Panel) ?Every 6-12 months based on history and medications ?May be recommended more frequently based on current conditions or  previous results ?Hemoglobin A1c Lab ?Every 3-12 months based on history and previous results ?Starting at age 45 or earlier with diagnosis of diabetes, high cholesterol, BMI >26, and/or risk factors ?Frequent monitoring for patients with diabetes to ensure blood sugar control ?Thyroid Panel (TSH w/ T3 & T4) ?Every 6 months based on history, symptoms, and risk factors ?May be repeated more often if on medication ?HIV ?One time testing for all patients 13 and older ?May be repeated more frequently for patients with increased risk factors or exposure ?Hepatitis C ?One time testing for all patients 18 and older ?May be repeated more frequently for patients with increased risk factors or exposure ?Gonorrhea, Chlamydia ?Every 12 months for all sexually active persons 13-24 years ?Additional monitoring may be recommended for those who are considered high risk or who have symptoms ?PSA ?Men 40-54 years old with risk factors ?Additional screening may be recommended from age 55-69 based on risk factors, symptoms, and history ? ?Vaccine Recommendations ?Tetanus Booster ?All adults every 10 years ?Flu Vaccine ?All patients 6 months and older every year ?COVID Vaccine ?All patients 12 years and older ?Initial dosing with booster ?May recommend additional booster based on age and health history ?HPV Vaccine ?2 doses all patients   age 9-26 ?Dosing may be considered for patients over 26 ?Shingles Vaccine (Shingrix) ?2 doses all adults 50 years and older ?Pneumonia (Pneumovax 23) ?All adults 65 years and older ?May recommend earlier dosing based on health history ?Pneumonia (Prevnar 13) ?All adults 65 years and older ?Dosed 1 year after Pneumovax 23 ?Pneumonia (Prevnar 20) ?All adults 65 years and older (adults 19-64 with certain conditions or risk factors) ?1 dose  ?For those who have no received Prevnar 13 vaccine previously ? ? ?Additional Screening, Testing, and Vaccinations may be recommended on an individualized basis based on  family history, health history, risk factors, and/or exposure.  ?__________________________________________________________ ? ?Diet Recommendations for All Patients ? ?I recommend that all patients maintain a diet

## 2022-01-10 LAB — HEPATITIS C ANTIBODY
Hepatitis C Ab: NONREACTIVE
SIGNAL TO CUT-OFF: 0.13 (ref <1.00)

## 2022-01-10 LAB — COMPREHENSIVE METABOLIC PANEL
ALT: 26 U/L (ref 0–35)
AST: 23 U/L (ref 0–37)
Albumin: 4.5 g/dL (ref 3.5–5.2)
Alkaline Phosphatase: 67 U/L (ref 39–117)
BUN: 14 mg/dL (ref 6–23)
CO2: 29 mEq/L (ref 19–32)
Calcium: 9.6 mg/dL (ref 8.4–10.5)
Chloride: 103 mEq/L (ref 96–112)
Creatinine, Ser: 0.82 mg/dL (ref 0.40–1.20)
GFR: 97.74 mL/min (ref >60.00)
Glucose, Bld: 89 mg/dL (ref 70–99)
Potassium: 4 mEq/L (ref 3.5–5.1)
Sodium: 139 mEq/L (ref 135–145)
Total Bilirubin: 0.3 mg/dL (ref 0.2–1.2)
Total Protein: 6.9 g/dL (ref 6.0–8.3)

## 2022-01-10 LAB — CERVICOVAGINAL ANCILLARY ONLY
Bacterial Vaginitis (gardnerella): NEGATIVE
Candida Glabrata: NEGATIVE
Candida Vaginitis: NEGATIVE
Chlamydia: POSITIVE — AB
Comment: NEGATIVE
Comment: NEGATIVE
Comment: NEGATIVE
Comment: NEGATIVE
Comment: NEGATIVE
Comment: NORMAL
Neisseria Gonorrhea: NEGATIVE
Trichomonas: NEGATIVE

## 2022-01-10 LAB — CBC
HCT: 41.4 % (ref 36.0–46.0)
Hemoglobin: 13.8 g/dL (ref 12.0–15.0)
MCHC: 33.4 g/dL (ref 30.0–36.0)
MCV: 95.6 fl (ref 78.0–100.0)
Platelets: 282 10*3/uL (ref 150.0–400.0)
RBC: 4.33 Mil/uL (ref 3.87–5.11)
RDW: 14 % (ref 11.5–15.5)
WBC: 7.3 10*3/uL (ref 4.0–10.5)

## 2022-01-10 LAB — LIPID PANEL
Cholesterol: 191 mg/dL (ref 0–200)
HDL: 86.5 mg/dL (ref >39.00)
LDL Cholesterol: 93 mg/dL (ref 0–99)
NonHDL: 104.45
Total CHOL/HDL Ratio: 2
Triglycerides: 58 mg/dL (ref 0.0–149.0)
VLDL: 11.6 mg/dL (ref 0.0–40.0)

## 2022-01-10 LAB — TSH: TSH: 1.72 u[IU]/mL (ref 0.35–5.50)

## 2022-01-10 LAB — RPR: RPR Ser Ql: NONREACTIVE

## 2022-01-10 LAB — HIV ANTIBODY (ROUTINE TESTING W REFLEX): HIV 1&2 Ab, 4th Generation: NONREACTIVE

## 2022-01-13 ENCOUNTER — Other Ambulatory Visit (HOSPITAL_COMMUNITY): Payer: Self-pay

## 2022-01-13 MED ORDER — DOXYCYCLINE HYCLATE 100 MG PO TABS
100.0000 mg | ORAL_TABLET | Freq: Two times a day (BID) | ORAL | 0 refills | Status: AC
  Filled 2022-01-13: qty 14, 7d supply, fill #0

## 2022-01-13 NOTE — Addendum Note (Signed)
Addended by: Caleen Jobs B on: 01/13/2022 08:23 AM ? ? Modules accepted: Orders ? ?

## 2022-01-14 LAB — CYTOLOGY - PAP
Comment: NEGATIVE
Comment: NEGATIVE
Comment: NEGATIVE
HPV 16: NEGATIVE
HPV 18 / 45: NEGATIVE
High risk HPV: POSITIVE — AB

## 2022-01-15 NOTE — Addendum Note (Signed)
Addended by: Hyman Hopes B on: 01/15/2022 04:02 PM ? ? Modules accepted: Orders ? ?

## 2022-02-25 ENCOUNTER — Ambulatory Visit (HOSPITAL_BASED_OUTPATIENT_CLINIC_OR_DEPARTMENT_OTHER): Payer: Self-pay | Admitting: Advanced Practice Midwife

## 2022-03-20 ENCOUNTER — Other Ambulatory Visit (HOSPITAL_COMMUNITY): Payer: Self-pay

## 2022-03-20 MED ORDER — AMOXICILLIN 500 MG PO CAPS
ORAL_CAPSULE | ORAL | 0 refills | Status: DC
  Filled 2022-03-20: qty 28, 7d supply, fill #0

## 2022-06-06 ENCOUNTER — Ambulatory Visit (INDEPENDENT_AMBULATORY_CARE_PROVIDER_SITE_OTHER): Payer: No Typology Code available for payment source | Admitting: Family

## 2022-06-06 ENCOUNTER — Other Ambulatory Visit (HOSPITAL_COMMUNITY): Payer: Self-pay

## 2022-06-06 ENCOUNTER — Encounter: Payer: Self-pay | Admitting: Family

## 2022-06-06 VITALS — BP 110/72 | HR 82 | Temp 98.3°F | Ht 60.0 in | Wt 138.8 lb

## 2022-06-06 DIAGNOSIS — J302 Other seasonal allergic rhinitis: Secondary | ICD-10-CM | POA: Diagnosis not present

## 2022-06-06 MED ORDER — ALBUTEROL SULFATE HFA 108 (90 BASE) MCG/ACT IN AERS
2.0000 | INHALATION_SPRAY | Freq: Four times a day (QID) | RESPIRATORY_TRACT | 0 refills | Status: DC | PRN
Start: 2022-06-06 — End: 2022-07-17
  Filled 2022-06-06: qty 6.7, 25d supply, fill #0

## 2022-06-06 NOTE — Assessment & Plan Note (Signed)
New. Recommended trial of claritin 10mg  once daily.  Trial of albuterol prn if breathing feels tight. She is advised to call if symptoms worsen or if not improved in 1 week.

## 2022-06-06 NOTE — Progress Notes (Signed)
Subjective:   By signing my name below, I, Cassell Clement, attest that this documentation has been prepared under the direction and in the presence of Birdie Sons, NP 06/06/2022   Patient ID: Charlene Reed, female    DOB: Apr 21, 1996, 26 y.o.   MRN: [id number]  Chief Complaint  Patient presents with   Wheezing    Cough  On set one month    HPI Patient is in today for an office visit  Wheezing: She is complaining of wheezing that she noticed at gym when she is catching her breath and worsens after receiving an influenza vaccine (2 weeks ago). Reports that since that time she has had cough and nasal congestion.   When exhaling she reports to feel rubbing within her chest.  Liver: She is requesting provider check if she has an enlarged liver as someone previous said she did.  Health Maintenance Due  Topic Date Due   INFLUENZA VACCINE  04/01/2022    No past medical history on file.  No past surgical history on file.  Family History  Problem Relation Age of Onset   Heart disease Father    Heart attack Father    High blood pressure Father    Breast cancer Maternal Grandmother     Social History   Socioeconomic History   Marital status: Single    Spouse name: Not on file   Number of children: Not on file   Years of education: Not on file   Highest education level: Not on file  Occupational History   Not on file  Tobacco Use   Smoking status: Never   Smokeless tobacco: Never  Vaping Use   Vaping Use: Never used  Substance and Sexual Activity   Alcohol use: Yes    Alcohol/week: 6.0 standard drinks of alcohol    Types: 6 Shots of liquor per week   Drug use: Never   Sexual activity: Yes    Partners: Male    Birth control/protection: Condom  Other Topics Concern   Not on file  Social History Narrative   Not on file   Social Determinants of Health   Financial Resource Strain: Not on file  Food Insecurity: Not on file  Transportation Needs: Not on file   Physical Activity: Not on file  Stress: Not on file  Social Connections: Not on file  Intimate Partner Violence: Not on file    Outpatient Medications Prior to Visit  Medication Sig Dispense Refill   Multiple Vitamin (MULTIVITAMIN) tablet Take 1 tablet by mouth daily.     amoxicillin (AMOXIL) 500 MG capsule Take 1 capsule by mouth every 6 hours as needed. (Patient not taking: Reported on 06/06/2022) 28 capsule 0   No facility-administered medications prior to visit.    No Known Allergies  Review of Systems  HENT:  Positive for congestion.   Respiratory:  Positive for cough and wheezing.        Objective:    Physical Exam Constitutional:      General: She is not in acute distress.    Appearance: Normal appearance. She is not ill-appearing.  HENT:     Head: Normocephalic and atraumatic.     Right Ear: External ear normal. Tympanic membrane is not erythematous or bulging.     Left Ear: External ear normal. A middle ear effusion is present. Tympanic membrane is erythematous and bulging.     Mouth/Throat:     Mouth: Mucous membranes are moist.  Pharynx: Oropharynx is clear. Uvula midline. No posterior oropharyngeal erythema.     Tonsils: 2+ on the right. 2+ on the left.     Comments: Patient is sniffing repeatedly due to nasal congestion Eyes:     Extraocular Movements: Extraocular movements intact.     Pupils: Pupils are equal, round, and reactive to light.  Cardiovascular:     Rate and Rhythm: Normal rate and regular rhythm.     Heart sounds: Normal heart sounds. No murmur heard.    No gallop.  Pulmonary:     Effort: Pulmonary effort is normal. No respiratory distress.     Breath sounds: Normal breath sounds. No wheezing or rales.  Abdominal:     Palpations: Abdomen is soft. There is no hepatomegaly or splenomegaly.     Tenderness: There is no abdominal tenderness.  Skin:    General: Skin is warm and dry.  Neurological:     Mental Status: She is alert and  oriented to person, place, and time.  Psychiatric:        Mood and Affect: Mood normal.        Behavior: Behavior normal.        Judgment: Judgment normal.     BP 110/72   Pulse 82   Temp 98.3 F (36.8 C) (Oral)   Ht 5' (1.524 m)   Wt 138 lb 12.8 oz (63 kg)   LMP 05/15/2022   SpO2 96%   BMI 27.11 kg/m  Wt Readings from Last 3 Encounters:  06/06/22 138 lb 12.8 oz (63 kg)  01/09/22 151 lb 3.2 oz (68.6 kg)  04/26/19 145 lb 6.4 oz (66 kg)       Assessment & Plan:   Problem List Items Addressed This Visit       Unprioritized   Seasonal allergic rhinitis - Primary    New. Recommended trial of claritin 10mg  once daily.  Trial of albuterol prn if breathing feels tight. She is advised to call if symptoms worsen or if not improved in 1 week.       Meds ordered this encounter  Medications   albuterol (VENTOLIN HFA) 108 (90 Base) MCG/ACT inhaler    Sig: Inhale 2 puffs into the lungs every 6 (six) hours as needed for wheezing or shortness of breath.    Dispense:  6.7 g    Refill:  0    Order Specific Question:   Supervising Provider    Answer:   Penni Homans A [[id number]]    I, Nance Pear, NP, personally preformed the services described in this documentation.  All medical record entries made by the scribe were at my direction and in my presence.  I have reviewed the chart and discharge instructions (if applicable) and agree that the record reflects my personal performance and is accurate and complete. 06/06/2022   I,Amber Collins,acting as a scribe for Nance Pear, NP.,have documented all relevant documentation on the behalf of Nance Pear, NP,as directed by  Nance Pear, NP while in the presence of Nance Pear, NP.    Nance Pear, NP

## 2022-07-03 ENCOUNTER — Ambulatory Visit
Admission: RE | Admit: 2022-07-03 | Discharge: 2022-07-03 | Disposition: A | Discharge status: Home / Self Care | Payer: No Typology Code available for payment source | Source: Ambulatory Visit | Attending: Urgent Care | Admitting: Urgent Care

## 2022-07-03 ENCOUNTER — Other Ambulatory Visit (HOSPITAL_COMMUNITY): Payer: Self-pay

## 2022-07-03 VITALS — BP 127/73 | HR 90 | Temp 98.7°F | Resp 16

## 2022-07-03 DIAGNOSIS — A09 Infectious gastroenteritis and colitis, unspecified: Secondary | ICD-10-CM | POA: Diagnosis not present

## 2022-07-03 DIAGNOSIS — R112 Nausea with vomiting, unspecified: Secondary | ICD-10-CM

## 2022-07-03 LAB — POCT URINALYSIS DIP (MANUAL ENTRY)
Bilirubin, UA: NEGATIVE
Glucose, UA: NEGATIVE mg/dL
Leukocytes, UA: NEGATIVE
Nitrite, UA: NEGATIVE
Protein Ur, POC: 30 mg/dL — AB
Spec Grav, UA: >=1.03 — AB (ref 1.010–1.025)
Urobilinogen, UA: 0.2 E.U./dL
pH, UA: 6.5 (ref 5.0–8.0)

## 2022-07-03 LAB — POCT URINE PREGNANCY: Preg Test, Ur: NEGATIVE

## 2022-07-03 MED ORDER — LOPERAMIDE HCL 2 MG PO CAPS
2.0000 mg | ORAL_CAPSULE | Freq: Two times a day (BID) | ORAL | 0 refills | Status: DC | PRN
  Filled 2022-07-03: qty 14, 7d supply, fill #0

## 2022-07-03 MED ORDER — ONDANSETRON 4 MG PO TBDP
4.0000 mg | ORAL_TABLET | Freq: Once | ORAL | Status: AC
  Administered 2022-07-03: 4 mg via ORAL

## 2022-07-03 MED ORDER — SODIUM CHLORIDE 0.9 % IV BOLUS
1000.0000 mL | Freq: Once | INTRAVENOUS | Status: AC
  Administered 2022-07-03: 1000 mL via INTRAVENOUS

## 2022-07-03 MED ORDER — ONDANSETRON 8 MG PO TBDP
8.0000 mg | ORAL_TABLET | Freq: Three times a day (TID) | ORAL | 0 refills | Status: DC | PRN
Start: 2022-07-03 — End: 2022-12-11
  Filled 2022-07-03: qty 20, 7d supply, fill #0

## 2022-07-03 NOTE — ED Triage Notes (Addendum)
Pt woke up today with generalized abd pains and n/v. Had one episode of diarrhea last night. Pain is intermittent Took tylenol this AM for pain.

## 2022-07-03 NOTE — ED Provider Notes (Signed)
[facility]  Note:  This document was prepared using Systems analyst and may include unintentional dictation errors.  MRN: [id number] DOB: 09/15/1995  Subjective:   Charlene Reed is a 26 y.o. female presenting for 1 day history of acute onset nausea, multiple episodes of vomiting and generalized abdominal pain.  Had an episode of diarrhea last night.  She has not been able to hold down any fluids or foods.  Has used over-the-counter medications with minimal relief.  No fever, bloody stools, hematemesis, recent antibiotic use, hospitalizations or long distance travel.  Has not eaten raw foods, drank unfiltered water.  No history of GI disorders including Crohn's, IBS, ulcerative colitis.   No current facility-administered medications for this encounter.  Current Outpatient Medications:    albuterol (VENTOLIN HFA) 108 (90 Base) MCG/ACT inhaler, Inhale 2 puffs into the lungs every 6 (six) hours as needed for wheezing or shortness of breath., Disp: 6.7 g, Rfl: 0   Multiple Vitamin (MULTIVITAMIN) tablet, Take 1 tablet by mouth daily., Disp: , Rfl:    No Known Allergies  History reviewed. No pertinent past medical history.   History reviewed. No pertinent surgical history.  Family History  Problem Relation Age of Onset   Heart disease Father    Heart attack Father    High blood pressure Father    Breast cancer Maternal Grandmother     Social History   Tobacco Use   Smoking status: Never   Smokeless tobacco: Never  Vaping Use   Vaping Use: Never used  Substance Use Topics   Alcohol use: Yes    Alcohol/week: 6.0 standard drinks of alcohol    Types: 6 Shots of liquor per week   Drug use: Never    ROS   Objective:   Vitals: BP 127/73 (BP Location: Left Arm)   Pulse 90   Temp 98.7 F (37.1 C) (Oral)   Resp 16   LMP 06/14/2022   SpO2 96%   Physical Exam Constitutional:      General: She is not in acute distress.     Appearance: Normal appearance. She is well-developed. She is not ill-appearing, toxic-appearing or diaphoretic.  HENT:     Head: Normocephalic and atraumatic.     Nose: Nose normal.     Mouth/Throat:     Mouth: Mucous membranes are moist.     Pharynx: Oropharynx is clear.  Eyes:     General: No scleral icterus.       Right eye: No discharge.        Left eye: No discharge.     Extraocular Movements: Extraocular movements intact.     Conjunctiva/sclera: Conjunctivae normal.  Cardiovascular:     Rate and Rhythm: Normal rate.  Pulmonary:     Effort: Pulmonary effort is normal.  Abdominal:     General: Bowel sounds are normal. There is no distension.     Palpations: Abdomen is soft. There is no mass.     Tenderness: There is abdominal tenderness in the left lower quadrant. There is no right CVA tenderness, left CVA tenderness, guarding or rebound.  Skin:    General: Skin is warm and dry.  Neurological:     General: No focal deficit present.     Mental Status: She is alert and oriented to person, place, and time.  Psychiatric:        Mood and Affect: Mood normal.        Behavior: Behavior normal.  Thought Content: Thought content normal.        Judgment: Judgment normal.     Results for orders placed or performed during the hospital encounter of 07/03/22 (from the past 24 hour(s))  POCT urine pregnancy     Status: None   Collection Time: 07/03/22  2:00 PM  Result Value Ref Range   Preg Test, Ur Negative Negative  POCT urinalysis dipstick     Status: Abnormal   Collection Time: 07/03/22  2:00 PM  Result Value Ref Range   Color, UA yellow yellow   Clarity, UA clear clear   Glucose, UA negative negative mg/dL   Bilirubin, UA negative negative   Ketones, POC UA large (80) (A) negative mg/dL   Spec Grav, UA >=1.791 (A) 1.010 - 1.025   Blood, UA moderate (A) negative   pH, UA 6.5 5.0 - 8.0   Protein Ur, POC =30 (A) negative mg/dL   Urobilinogen, UA 0.2 0.2 or 1.0 E.U./dL    Nitrite, UA Negative Negative   Leukocytes, UA Negative Negative   IV fluid bolus given of 1000 cc normal saline over period of 47 minutes.  Assessment and Plan :   PDMP not reviewed this encounter.  1. Gastroenteritis/colitis, infectious   2. Nausea vomiting and diarrhea     Rehydration as above. Will manage for suspected colitis, gastroenteritis with supportive care.  Recommended patient hydrate well, eat light meals and maintain electrolytes.  Will use Zofran and Imodium for nausea, vomiting and diarrhea. Counseled patient on potential for adverse effects with medications prescribed/recommended today, ER and return-to-clinic precautions discussed, patient verbalized understanding.    Wallis Bamberg, PA-C 07/03/22 1436

## 2022-07-14 ENCOUNTER — Ambulatory Visit: Payer: No Typology Code available for payment source | Admitting: Family Medicine

## 2022-07-16 NOTE — Progress Notes (Unsigned)
   Established Patient Office Visit  Subjective   Patient ID: Charlene Reed, female    DOB: 1996-05-27  Age: 26 y.o. MRN: [id number]  No chief complaint on file.   HPI  Patient is here for ***. She is requesting behavioral health referral.   {History (Optional):23778}  ROS    Objective:     LMP 06/14/2022  {Vitals History (Optional):23777}  Physical Exam   No results found for any visits on 07/17/22.  {Labs (Optional):23779}  The ASCVD Risk score (Arnett DK, et al., 2019) failed to calculate for the following reasons:   The 2019 ASCVD risk score is only valid for ages 64 to 65    Assessment & Plan:   Problem List Items Addressed This Visit   None   No follow-ups on file.    Clayborne Dana, NP

## 2022-07-17 ENCOUNTER — Ambulatory Visit (INDEPENDENT_AMBULATORY_CARE_PROVIDER_SITE_OTHER): Payer: No Typology Code available for payment source | Admitting: Family Medicine

## 2022-07-17 ENCOUNTER — Other Ambulatory Visit (HOSPITAL_COMMUNITY): Payer: Self-pay

## 2022-07-17 ENCOUNTER — Encounter: Payer: Self-pay | Admitting: Family Medicine

## 2022-07-17 VITALS — BP 100/80 | HR 60 | Resp 18 | Ht 60.0 in | Wt 134.6 lb

## 2022-07-17 DIAGNOSIS — F1011 Alcohol abuse, in remission: Secondary | ICD-10-CM

## 2022-07-17 DIAGNOSIS — R5383 Other fatigue: Secondary | ICD-10-CM

## 2022-07-17 LAB — COMPREHENSIVE METABOLIC PANEL
ALT: 24 U/L (ref 0–35)
AST: 24 U/L (ref 0–37)
Albumin: 4.4 g/dL (ref 3.5–5.2)
Alkaline Phosphatase: 44 U/L (ref 39–117)
BUN: 10 mg/dL (ref 6–23)
CO2: 25 mEq/L (ref 19–32)
Calcium: 9.3 mg/dL (ref 8.4–10.5)
Chloride: 105 mEq/L (ref 96–112)
Creatinine, Ser: 0.74 mg/dL (ref 0.40–1.20)
GFR: 110.16 mL/min (ref >60.00)
Glucose, Bld: 77 mg/dL (ref 70–99)
Potassium: 4.1 mEq/L (ref 3.5–5.1)
Sodium: 139 mEq/L (ref 135–145)
Total Bilirubin: 0.2 mg/dL (ref 0.2–1.2)
Total Protein: 6.9 g/dL (ref 6.0–8.3)

## 2022-07-17 LAB — CBC
HCT: 41.9 % (ref 36.0–46.0)
Hemoglobin: 14.1 g/dL (ref 12.0–15.0)
MCHC: 33.7 g/dL (ref 30.0–36.0)
MCV: 96 fl (ref 78.0–100.0)
Platelets: 376 10*3/uL (ref 150.0–400.0)
RBC: 4.36 Mil/uL (ref 3.87–5.11)
RDW: 13.1 % (ref 11.5–15.5)
WBC: 6.1 10*3/uL (ref 4.0–10.5)

## 2022-07-17 LAB — TSH: TSH: 1.82 u[IU]/mL (ref 0.35–5.50)

## 2022-07-17 LAB — VITAMIN B12: Vitamin B-12: >1500 pg/mL — ABNORMAL HIGH (ref 211–911)

## 2022-07-17 MED ORDER — ALBUTEROL SULFATE HFA 108 (90 BASE) MCG/ACT IN AERS
2.0000 | INHALATION_SPRAY | Freq: Four times a day (QID) | RESPIRATORY_TRACT | 0 refills | Status: DC | PRN
  Filled 2022-07-17 – 2022-08-22 (×2): qty 6.7, 25d supply, fill #0

## 2022-07-25 ENCOUNTER — Other Ambulatory Visit (HOSPITAL_COMMUNITY): Payer: Self-pay

## 2022-07-31 ENCOUNTER — Other Ambulatory Visit (HOSPITAL_COMMUNITY): Payer: Self-pay

## 2022-07-31 MED ORDER — CHLORHEXIDINE GLUCONATE 0.12 % MT SOLN
15.0000 mL | Freq: Two times a day (BID) | OROMUCOSAL | 0 refills | Status: DC
  Filled 2022-07-31: qty 473, 16d supply, fill #0

## 2022-07-31 MED ORDER — AMOXICILLIN 500 MG PO CAPS
ORAL_CAPSULE | ORAL | 1 refills | Status: DC
  Filled 2022-07-31: qty 30, 10d supply, fill #0
  Filled 2022-08-22: qty 30, 10d supply, fill #1

## 2022-08-05 ENCOUNTER — Other Ambulatory Visit (HOSPITAL_COMMUNITY): Payer: Self-pay

## 2022-08-22 ENCOUNTER — Other Ambulatory Visit (HOSPITAL_COMMUNITY): Payer: Self-pay

## 2022-08-22 ENCOUNTER — Other Ambulatory Visit: Payer: Self-pay

## 2022-08-22 ENCOUNTER — Encounter (HOSPITAL_COMMUNITY): Payer: Self-pay

## 2022-09-25 ENCOUNTER — Encounter: Payer: Self-pay | Admitting: Emergency Medicine

## 2022-09-25 ENCOUNTER — Ambulatory Visit
Admission: EM | Admit: 2022-09-25 | Discharge: 2022-09-25 | Disposition: A | Discharge status: Home / Self Care | Payer: BC Managed Care – PPO | Attending: Physician Assistant | Admitting: Physician Assistant

## 2022-09-25 DIAGNOSIS — Z1152 Encounter for screening for COVID-19: Secondary | ICD-10-CM

## 2022-09-25 DIAGNOSIS — J069 Acute upper respiratory infection, unspecified: Secondary | ICD-10-CM

## 2022-09-25 DIAGNOSIS — H1032 Unspecified acute conjunctivitis, left eye: Secondary | ICD-10-CM | POA: Diagnosis not present

## 2022-09-25 MED ORDER — POLYMYXIN B-TRIMETHOPRIM 10000-0.1 UNIT/ML-% OP SOLN: 1.0000 [drp] | OPHTHALMIC | 0 refills | Status: AC

## 2022-09-25 MED ORDER — ALBUTEROL SULFATE HFA 108 (90 BASE) MCG/ACT IN AERS: 1.0000 | INHALATION_SPRAY | Freq: Four times a day (QID) | RESPIRATORY_TRACT | 0 refills | Status: DC | PRN

## 2022-09-25 NOTE — ED Triage Notes (Signed)
Reports cough, congestion x 2 days. Went to the gym and noticed new SOB and worsening cough. Also noticed new itching and redness from left eye

## 2022-09-25 NOTE — ED Provider Notes (Signed)
[facility]    CSN: [id number] Arrival date & time: 09/25/22  1917      History   Chief Complaint Chief Complaint  Patient presents with   Cough    HPI Charlene Reed is a 27 y.o. female.   Patient here today for evaluation of cough and congestion she has had the last 2 days. Today she started to notice more shortness of breath when she was working out as well as worsening cough. She also noticed some redness and itching to her left eye. She denies any fever. She does not report sore throat, ear pain. She denies nausea, vomiting or diarrhea. She does not report treatment for symptoms.  The history is provided by the patient.  Cough Associated symptoms: wheezing   Associated symptoms: no chills, no ear pain, no eye discharge, no fever, no shortness of breath and no sore throat     History reviewed. No pertinent past medical history.  Patient Active Problem List   Diagnosis Date Noted   Seasonal allergic rhinitis 06/06/2022   Acne vulgaris 06/11/2018    History reviewed. No pertinent surgical history.  OB History   No obstetric history on file.      Home Medications    Prior to Admission medications   Medication Sig Start Date End Date Taking? Authorizing Provider  albuterol (VENTOLIN HFA) 108 (90 Base) MCG/ACT inhaler Inhale 1-2 puffs into the lungs every 6 (six) hours as needed for wheezing or shortness of breath. 09/25/22  Yes Tomi Bamberger, PA-C  trimethoprim-polymyxin b (POLYTRIM) ophthalmic solution Place 1 drop into the left eye every 4 (four) hours for 7 days. 09/25/22 10/02/22 Yes Tomi Bamberger, PA-C  amoxicillin (AMOXIL) 500 MG capsule Take 2 capsules by mouth now, then 1 capsule 3 times daily until gone. 07/31/22     chlorhexidine (PERIDEX) 0.12 % solution Risne 15 mLs by mouth in the morning after breakfast and at bedtime. 07/31/22     loperamide (IMODIUM) 2 MG capsule Take 1 capsule (2 mg total) by mouth 2 (two) times daily as needed for  diarrhea or loose stools. 07/03/22   Wallis Bamberg, PA-C  Multiple Vitamin (MULTIVITAMIN) tablet Take 1 tablet by mouth daily.    [provider]  ondansetron (ZOFRAN-ODT) 8 MG disintegrating tablet Take 1 tablet (8 mg total) by mouth every 8 (eight) hours as needed for nausea or vomiting. 07/03/22   Wallis Bamberg, PA-C    Family History Family History  Problem Relation Age of Onset   Heart disease Father    Heart attack Father    High blood pressure Father    Breast cancer Maternal Grandmother     Social History Social History   Tobacco Use   Smoking status: Never   Smokeless tobacco: Never  Vaping Use   Vaping Use: Never used  Substance Use Topics   Alcohol use: Yes    Alcohol/week: 6.0 standard drinks of alcohol    Types: 6 Shots of liquor per week   Drug use: Never     Allergies   Patient has no known allergies.   Review of Systems Review of Systems  Constitutional:  Negative for chills and fever.  HENT:  Positive for congestion. Negative for ear pain and sore throat.   Eyes:  Negative for discharge and redness.  Respiratory:  Positive for cough and wheezing. Negative for shortness of breath.   Gastrointestinal:  Negative for abdominal pain, diarrhea, nausea and vomiting.     Physical  Exam Triage Vital Signs ED Triage Vitals  Enc Vitals Group     BP 09/25/22 1930 119/74     Pulse Rate 09/25/22 1930 98     Resp 09/25/22 1930 16     Temp 09/25/22 1930 97.8 F (36.6 C)     Temp Source 09/25/22 1930 Oral     SpO2 09/25/22 1930 96 %     Weight --      Height --      Head Circumference --      Peak Flow --      Pain Score 09/25/22 1936 0     Pain Loc --      Pain Edu? --      Excl. in [place]? --    No data found.  Updated Vital Signs BP 119/74 (BP Location: Right Arm)   Pulse 98   Temp 97.8 F (36.6 C) (Oral)   Resp 16   SpO2 96%    Physical Exam Vitals and nursing note reviewed.  Constitutional:      General: She is not in acute distress.     Appearance: Normal appearance. She is not ill-appearing.  HENT:     Head: Normocephalic and atraumatic.     Nose: Congestion (mild) present.     Mouth/Throat:     Mouth: Mucous membranes are moist.     Pharynx: No oropharyngeal exudate or posterior oropharyngeal erythema.  Eyes:     Comments: Left conjunctiva mildly injected, right conjunctiva normal  Cardiovascular:     Rate and Rhythm: Normal rate and regular rhythm.     Heart sounds: Normal heart sounds. No murmur heard. Pulmonary:     Effort: Pulmonary effort is normal. No respiratory distress.     Breath sounds: Wheezing (mild, diffuse) present. No rhonchi or rales.  Skin:    General: Skin is warm and dry.  Neurological:     Mental Status: She is alert.  Psychiatric:        Mood and Affect: Mood normal.        Thought Content: Thought content normal.      UC Treatments / Results  Labs (all labs ordered are listed, but only abnormal results are displayed) Labs Reviewed  SARS CORONAVIRUS 2 (TAT 6-24 HRS)    EKG   Radiology No results found.  Procedures Procedures (including critical care time)  Medications Ordered in UC Medications - No data to display  Initial Impression / Assessment and Plan / UC Course  I have reviewed the triage vital signs and the nursing notes.  Pertinent labs & imaging results that were available during my care of the patient were reviewed by me and considered in my medical decision making (see chart for details).    Suspect viral etiology of upper respiratory infection. Will screen for covid. Albuterol inhaler prescribed to hopefully help with wheezing, shortness of breath. Recommend antibiotic drops for treatment of conjunctivitis. Encouraged follow up if no gradual improvement or with any further concerns.   Final Clinical Impressions(s) / UC Diagnoses   Final diagnoses:  Acute upper respiratory infection  Encounter for screening for COVID-19  Acute conjunctivitis of left eye,  unspecified acute conjunctivitis type   Discharge Instructions   None    ED Prescriptions     Medication Sig Dispense Auth. Provider   trimethoprim-polymyxin b (POLYTRIM) ophthalmic solution Place 1 drop into the left eye every 4 (four) hours for 7 days. 10 mL Francene Finders, PA-C   albuterol (VENTOLIN HFA)  108 (90 Base) MCG/ACT inhaler Inhale 1-2 puffs into the lungs every 6 (six) hours as needed for wheezing or shortness of breath. 8 g Francene Finders, PA-C      PDMP not reviewed this encounter.   Francene Finders, PA-C 09/25/22 2006

## 2022-09-26 LAB — SARS CORONAVIRUS 2 (TAT 6-24 HRS): SARS Coronavirus 2: NEGATIVE

## 2022-10-27 DIAGNOSIS — J019 Acute sinusitis, unspecified: Secondary | ICD-10-CM | POA: Diagnosis not present

## 2022-10-31 DIAGNOSIS — M546 Pain in thoracic spine: Secondary | ICD-10-CM | POA: Diagnosis not present

## 2022-11-03 DIAGNOSIS — M546 Pain in thoracic spine: Secondary | ICD-10-CM | POA: Diagnosis not present

## 2022-11-20 DIAGNOSIS — L818 Other specified disorders of pigmentation: Secondary | ICD-10-CM | POA: Diagnosis not present

## 2022-11-20 DIAGNOSIS — L718 Other rosacea: Secondary | ICD-10-CM | POA: Diagnosis not present

## 2022-11-20 DIAGNOSIS — L700 Acne vulgaris: Secondary | ICD-10-CM | POA: Diagnosis not present

## 2022-11-29 ENCOUNTER — Emergency Department (HOSPITAL_COMMUNITY)
Admission: EM | Admit: 2022-11-29 | Discharge: 2022-11-29 | Disposition: A | Discharge status: Home / Self Care | Payer: BC Managed Care – PPO | Attending: Emergency Medicine | Admitting: Emergency Medicine

## 2022-11-29 ENCOUNTER — Encounter (HOSPITAL_COMMUNITY): Payer: Self-pay

## 2022-11-29 DIAGNOSIS — R7309 Other abnormal glucose: Secondary | ICD-10-CM | POA: Insufficient documentation

## 2022-11-29 DIAGNOSIS — R630 Anorexia: Secondary | ICD-10-CM | POA: Diagnosis not present

## 2022-11-29 DIAGNOSIS — E876 Hypokalemia: Secondary | ICD-10-CM | POA: Insufficient documentation

## 2022-11-29 DIAGNOSIS — E1165 Type 2 diabetes mellitus with hyperglycemia: Secondary | ICD-10-CM | POA: Diagnosis not present

## 2022-11-29 DIAGNOSIS — R1013 Epigastric pain: Secondary | ICD-10-CM | POA: Diagnosis not present

## 2022-11-29 DIAGNOSIS — R1110 Vomiting, unspecified: Secondary | ICD-10-CM | POA: Insufficient documentation

## 2022-11-29 LAB — COMPREHENSIVE METABOLIC PANEL
ALT: 28 U/L (ref 0–44)
AST: 34 U/L (ref 15–41)
Albumin: 4.2 g/dL (ref 3.5–5.0)
Alkaline Phosphatase: 60 U/L (ref 38–126)
Anion gap: 12 (ref 5–15)
BUN: 9 mg/dL (ref 6–20)
CO2: 21 mmol/L — ABNORMAL LOW (ref 22–32)
Calcium: 8.6 mg/dL — ABNORMAL LOW (ref 8.9–10.3)
Chloride: 104 mmol/L (ref 98–111)
Creatinine, Ser: 0.79 mg/dL (ref 0.44–1.00)
GFR, Estimated: >60 mL/min (ref >60)
Glucose, Bld: 148 mg/dL — ABNORMAL HIGH (ref 70–99)
Potassium: 3 mmol/L — ABNORMAL LOW (ref 3.5–5.1)
Sodium: 137 mmol/L (ref 135–145)
Total Bilirubin: 0.5 mg/dL (ref 0.3–1.2)
Total Protein: 7.9 g/dL (ref 6.5–8.1)

## 2022-11-29 LAB — CBC
HCT: 43.5 % (ref 36.0–46.0)
Hemoglobin: 14.8 g/dL (ref 12.0–15.0)
MCH: 31.8 pg (ref 26.0–34.0)
MCHC: 34 g/dL (ref 30.0–36.0)
MCV: 93.5 fL (ref 80.0–100.0)
Platelets: 388 10*3/uL (ref 150–400)
RBC: 4.65 MIL/uL (ref 3.87–5.11)
RDW: 13.2 % (ref 11.5–15.5)
WBC: 7.6 10*3/uL (ref 4.0–10.5)
nRBC: 0 % (ref 0.0–0.2)

## 2022-11-29 LAB — URINALYSIS, ROUTINE W REFLEX MICROSCOPIC
Bilirubin Urine: NEGATIVE
Glucose, UA: NEGATIVE mg/dL
Hgb urine dipstick: NEGATIVE
Ketones, ur: NEGATIVE mg/dL
Nitrite: NEGATIVE
Protein, ur: NEGATIVE mg/dL
Specific Gravity, Urine: 1.008 (ref 1.005–1.030)
pH: 7 (ref 5.0–8.0)

## 2022-11-29 LAB — I-STAT BETA HCG BLOOD, ED (MC, WL, AP ONLY): I-stat hCG, quantitative: <5 m[IU]/mL (ref <5)

## 2022-11-29 LAB — LIPASE, BLOOD: Lipase: 31 U/L (ref 11–51)

## 2022-11-29 MED ORDER — POTASSIUM CHLORIDE CRYS ER 20 MEQ PO TBCR
40.0000 meq | EXTENDED_RELEASE_TABLET | Freq: Once | ORAL | Status: AC
  Administered 2022-11-29: 40 meq via ORAL
  Filled 2022-11-29: qty 2

## 2022-11-29 MED ORDER — ONDANSETRON HCL 4 MG PO TABS: 4.0000 mg | ORAL_TABLET | Freq: Three times a day (TID) | ORAL | 0 refills | Status: DC | PRN

## 2022-11-29 MED ORDER — HYOSCYAMINE SULFATE 0.125 MG PO TABS
0.1250 mg | ORAL_TABLET | Freq: Once | ORAL | Status: AC
  Administered 2022-11-29: 0.125 mg via ORAL
  Filled 2022-11-29: qty 1

## 2022-11-29 MED ORDER — FAMOTIDINE 20 MG PO TABS
20.0000 mg | ORAL_TABLET | Freq: Once | ORAL | Status: AC
  Administered 2022-11-29: 20 mg via ORAL
  Filled 2022-11-29: qty 1

## 2022-11-29 MED ORDER — DICYCLOMINE HCL 20 MG PO TABS: 20.0000 mg | ORAL_TABLET | Freq: Two times a day (BID) | ORAL | 0 refills | Status: DC

## 2022-11-29 MED ORDER — ACETAMINOPHEN 325 MG PO TABS
650.0000 mg | ORAL_TABLET | Freq: Once | ORAL | Status: AC
  Administered 2022-11-29: 650 mg via ORAL
  Filled 2022-11-29: qty 2

## 2022-11-29 MED ORDER — ONDANSETRON HCL 4 MG/2ML IJ SOLN
4.0000 mg | Freq: Once | INTRAMUSCULAR | Status: AC
  Administered 2022-11-29: 4 mg via INTRAVENOUS
  Filled 2022-11-29: qty 2

## 2022-11-29 MED ORDER — FAMOTIDINE 20 MG PO TABS: 20.0000 mg | ORAL_TABLET | Freq: Two times a day (BID) | ORAL | 0 refills | Status: DC

## 2022-11-29 NOTE — ED Triage Notes (Signed)
Pt did an abs workout 2 days ago, was sore from the that. However, last night, Pt states she has had nausea and emesis x 3 and abd cramping

## 2022-11-29 NOTE — ED Notes (Signed)
Pt ambulatory to restroom

## 2022-11-29 NOTE — Discharge Instructions (Signed)

## 2022-11-29 NOTE — ED Provider Notes (Signed)
[facility] Note   CSN: [id number] Arrival date & time: 11/29/22  1141     History  Chief Complaint  Patient presents with   Abdominal Pain    Charlene Reed is a 27 y.o. female who presents emergency department chief complaint of epigastric abdominal pain and vomiting.  Patient states she did app and chest workout 4 days ago and is not sure if that is related but had some abdominal soreness for the past 4 days.  Yesterday she began having epigastric abdominal pain.  Pain does not radiate.  She had 2 episodes of vomiting.  She denies any diarrhea, constipation.  She has decreased appetite.  She denies fevers chills ingestion of suspicious foods.  She denies any previous abdominal surgeries.  Charlene Reed is a 27 y.o. female who presents with vomiting The history is gathered by patient and EMR, nursing intake The emergent differential diagnosis for vomiting includes, but is not limited to ACS/MI, DKA, Ischemic bowel, Meningitis, Sepsis, Acute gastric dilation, Adrenal insufficiency, Appendicitis,  Bowel obstruction/ileus, Carbon monoxide poisoning, Cholecystitis, Electrolyte abnormalities, Elevated ICP, Gastric outlet obstruction, Pancreatitis, Ruptured viscus, Biliary colic, Cannabinoid hyperemesis syndrome, Gastritis, Gastroenteritis, Gastroparesis,  Narcotic withdrawal, Peptic ulcer disease, and UTI   I personally reviewed the patient's labs which show hyperglycemia without elevated white blood cell count unsure of etiology will have patient follow-up in the outpatient setting for fasting blood glucose, urine may or may not show infection however patient is asymptomatic we will send for culture to rule out infection as a cause of vomiting patient mildly hyponatremic and potassium repleted orally orally  I considered CT imaging of the abdomen however patient has benign abdominal exam   I ordered  medications Medications ondansetron (ZOFRAN) injection 4 mg (4 mg Intravenous Given 11/29/22 1240) hyoscyamine (LEVSIN) tablet 0.125 mg (0.125 mg Oral Given 11/29/22 1307) acetaminophen (TYLENOL) tablet 650 mg (650 mg Oral Given 11/29/22 1238) famotidine (PEPCID) tablet 20 mg (20 mg Oral Given 11/29/22 1238) potassium chloride SA (KLOR-CON M) CR tablet 40 mEq (40 mEq Oral Given 11/29/22 1335) On reevaluation patient felt significantly improved  Given the large differential diagnosis for Charlene Reed, the decision making in this case is of high complexity.  After evaluating all of the data points in this case, the presentation of Charlene Reed is NOT consistent with AAA; Mesenteric Ischemia; Bowel Perforation; Bowel Obstruction; Sigmoid Volvulus; Diverticulitis; Appendicitis; Peritonitis; Cholecystitis, ascending cholangitis or other gallbladder disease; perforated ulcer; significant GI bleeding, splenic rupture/infarction; Hepatic abscess; or other surgical/acute abdomen.  Similarly, this presentation is NOT consistent with fistula; incarcerated hernia; Pancreatitis; Diabetic Ketoacidosis; Kidney Stone; Ischemic colitis; Psoas or other abscess; Methanol poisoning; Heavy metal toxicity; or porphyria.  The case is NOT consistent with Fitz-Hugh-Curtis Syndrome, Ectopic Pregnancy, Placental Abruption, PID, Tubo-ovarian abscess, Ovarian Torsion, or STI. This presentation is also NOT consistent with acute coronary syndrome, MI, pulmonary embolism, dissection, borhaave's, arrythmia, pneumothorax, cardiac tamponade, or other emergent cardiopulmonary condition. Moreover, this presentation is NOT consistent with sepsis, pyelonephritis, urinary infection, pneumonia, or other focal bacterial infection.  Strict return and follow-up precautions have been given by me personally  ore by detailed written instructions verbalized by nursing staff using the teach back method to the patient/family/caregiver(s).  Data  Reviewed/Counseling: I have reviewed the patient's vital signs, nursing notes, and other relevant tests/information. I had a detailed discussion regarding the historical points, exam findings, and any diagnostic results supporting the discharge diagnosis. I also discussed  the need for outpatient follow-up and the need to return to the ED if symptoms worsen or if there are any questions or concerns that arise at home         Abdominal Pain      Home Medications Prior to Admission medications   Medication Sig Start Date End Date Taking? Authorizing Provider  albuterol (VENTOLIN HFA) 108 (90 Base) MCG/ACT inhaler Inhale 1-2 puffs into the lungs every 6 (six) hours as needed for wheezing or shortness of breath. 09/25/22   Francene Finders, PA-C  amoxicillin (AMOXIL) 500 MG capsule Take 2 capsules by mouth now, then 1 capsule 3 times daily until gone. 07/31/22     chlorhexidine (PERIDEX) 0.12 % solution Risne 15 mLs by mouth in the morning after breakfast and at bedtime. 07/31/22     loperamide (IMODIUM) 2 MG capsule Take 1 capsule (2 mg total) by mouth 2 (two) times daily as needed for diarrhea or loose stools. 07/03/22   Jaynee Eagles, PA-C  Multiple Vitamin (MULTIVITAMIN) tablet Take 1 tablet by mouth daily.    [provider]  ondansetron (ZOFRAN-ODT) 8 MG disintegrating tablet Take 1 tablet (8 mg total) by mouth every 8 (eight) hours as needed for nausea or vomiting. 07/03/22   Jaynee Eagles, PA-C      Allergies    Patient has no known allergies.    Review of Systems   Review of Systems  Gastrointestinal:  Positive for abdominal pain.    Physical Exam Updated Vital Signs BP (!) 156/86 (BP Location: Left Arm)   Pulse 99   Temp 97.9 F (36.6 C) (Oral)   Resp 16   Ht 5' (1.524 m)   Wt 59 kg   LMP 11/18/2022   SpO2 98%   BMI 25.39 kg/m  Physical Exam Vitals and nursing note reviewed.  Constitutional:      General: She is not in acute distress.    Appearance: She is  well-developed. She is not diaphoretic.  HENT:     Head: Normocephalic and atraumatic.     Right Ear: External ear normal.     Left Ear: External ear normal.     Nose: Nose normal.     Mouth/Throat:     Mouth: Mucous membranes are moist.  Eyes:     General: No scleral icterus.    Conjunctiva/sclera: Conjunctivae normal.  Cardiovascular:     Rate and Rhythm: Normal rate and regular rhythm.     Heart sounds: Normal heart sounds. No murmur heard.    No friction rub. No gallop.  Pulmonary:     Effort: Pulmonary effort is normal. No respiratory distress.     Breath sounds: Normal breath sounds.  Abdominal:     General: Bowel sounds are normal. There is no distension.     Palpations: Abdomen is soft. There is no mass.     Tenderness: There is no abdominal tenderness. There is no guarding.  Musculoskeletal:     Cervical back: Normal range of motion.  Skin:    General: Skin is warm and dry.  Neurological:     Mental Status: She is alert and oriented to person, place, and time.  Psychiatric:        Behavior: Behavior normal.     ED Results / Procedures / Treatments   Labs (all labs ordered are listed, but only abnormal results are displayed) Labs Reviewed  COMPREHENSIVE METABOLIC PANEL - Abnormal; Notable for the following components:      Result  Value   Potassium 3.0 (*)    CO2 21 (*)    Glucose, Bld 148 (*)    Calcium 8.6 (*)    All other components within normal limits  URINALYSIS, ROUTINE W REFLEX MICROSCOPIC - Abnormal; Notable for the following components:   APPearance HAZY (*)    Leukocytes,Ua MODERATE (*)    Bacteria, UA RARE (*)    All other components within normal limits  LIPASE, BLOOD  CBC  I-STAT BETA HCG BLOOD, ED ([facility], [facility], [facility] ONLY)    EKG None  Radiology No results found.  Procedures Procedures    Medications Ordered in ED Medications  potassium chloride SA (KLOR-CON M) CR tablet 40 mEq (has no administration in time range)  ondansetron (ZOFRAN)  injection 4 mg (4 mg Intravenous Given 11/29/22 1240)  hyoscyamine (LEVSIN) tablet 0.125 mg (0.125 mg Oral Given 11/29/22 1307)  acetaminophen (TYLENOL) tablet 650 mg (650 mg Oral Given 11/29/22 1238)  famotidine (PEPCID) tablet 20 mg (20 mg Oral Given 11/29/22 1238)    ED Course/ Medical Decision Making/ A&P Clinical Course as of 11/29/22 1332  Sat Nov 29, 2022  1332 Potassium(!): 3.0 [AH]  1332 Glucose(!): 148 [AH]  1332 Calcium(!): 8.6 [AH]  1332 WBC, UA: 11-20 [AH]  1332 Bacteria, UA(!): RARE [AH]  1332 Chalmers GuestMarland Kitchen): MODERATE [AH]    Clinical Course User Index [AH] Margarita Mail, PA-C                             Medical Decision Making Amount and/or Complexity of Data Reviewed Labs: ordered. Decision-making details documented in ED Course.  Risk OTC drugs. Prescription drug management.           Final Clinical Impression(s) / ED Diagnoses Final diagnoses:  Epigastric pain  Hypokalemia  Elevated random blood glucose level    Rx / DC Orders ED Discharge Orders     None         Margarita Mail, PA-C 11/29/22 2300    Regan Lemming, MD 11/30/22 [phone]

## 2022-11-30 ENCOUNTER — Other Ambulatory Visit: Payer: Self-pay

## 2022-11-30 ENCOUNTER — Emergency Department (HOSPITAL_BASED_OUTPATIENT_CLINIC_OR_DEPARTMENT_OTHER): Payer: BC Managed Care – PPO

## 2022-11-30 ENCOUNTER — Encounter (HOSPITAL_BASED_OUTPATIENT_CLINIC_OR_DEPARTMENT_OTHER): Payer: Self-pay | Admitting: Emergency Medicine

## 2022-11-30 ENCOUNTER — Emergency Department (HOSPITAL_BASED_OUTPATIENT_CLINIC_OR_DEPARTMENT_OTHER)
Admission: EM | Admit: 2022-11-30 | Discharge: 2022-11-30 | Disposition: A | Discharge status: Home / Self Care | Payer: BC Managed Care – PPO | Attending: Emergency Medicine | Admitting: Emergency Medicine

## 2022-11-30 DIAGNOSIS — R112 Nausea with vomiting, unspecified: Secondary | ICD-10-CM | POA: Diagnosis not present

## 2022-11-30 DIAGNOSIS — R1013 Epigastric pain: Secondary | ICD-10-CM | POA: Insufficient documentation

## 2022-11-30 LAB — CBC WITH DIFFERENTIAL/PLATELET
Abs Immature Granulocytes: 0.03 10*3/uL (ref 0.00–0.07)
Basophils Absolute: 0.1 10*3/uL (ref 0.0–0.1)
Basophils Relative: 1 %
Eosinophils Absolute: 0.1 10*3/uL (ref 0.0–0.5)
Eosinophils Relative: 1 %
HCT: 45.4 % (ref 36.0–46.0)
Hemoglobin: 15.6 g/dL — ABNORMAL HIGH (ref 12.0–15.0)
Immature Granulocytes: 0 %
Lymphocytes Relative: 27 %
Lymphs Abs: 2.5 10*3/uL (ref 0.7–4.0)
MCH: 31.6 pg (ref 26.0–34.0)
MCHC: 34.4 g/dL (ref 30.0–36.0)
MCV: 91.9 fL (ref 80.0–100.0)
Monocytes Absolute: 0.3 10*3/uL (ref 0.1–1.0)
Monocytes Relative: 4 %
Neutro Abs: 6.2 10*3/uL (ref 1.7–7.7)
Neutrophils Relative %: 67 %
Platelets: 390 10*3/uL (ref 150–400)
RBC: 4.94 MIL/uL (ref 3.87–5.11)
RDW: 13.1 % (ref 11.5–15.5)
WBC: 9.3 10*3/uL (ref 4.0–10.5)
nRBC: 0 % (ref 0.0–0.2)

## 2022-11-30 LAB — URINALYSIS, ROUTINE W REFLEX MICROSCOPIC
Bilirubin Urine: NEGATIVE
Glucose, UA: NEGATIVE mg/dL
Hgb urine dipstick: NEGATIVE
Ketones, ur: NEGATIVE mg/dL
Leukocytes,Ua: NEGATIVE
Nitrite: NEGATIVE
Specific Gravity, Urine: 1.024 (ref 1.005–1.030)
pH: 6.5 (ref 5.0–8.0)

## 2022-11-30 LAB — COMPREHENSIVE METABOLIC PANEL
ALT: 29 U/L (ref 0–44)
AST: 36 U/L (ref 15–41)
Albumin: 4.6 g/dL (ref 3.5–5.0)
Alkaline Phosphatase: 67 U/L (ref 38–126)
Anion gap: 13 (ref 5–15)
BUN: 10 mg/dL (ref 6–20)
CO2: 25 mmol/L (ref 22–32)
Calcium: 9.3 mg/dL (ref 8.9–10.3)
Chloride: 100 mmol/L (ref 98–111)
Creatinine, Ser: 0.89 mg/dL (ref 0.44–1.00)
GFR, Estimated: >60 mL/min (ref >60)
Glucose, Bld: 97 mg/dL (ref 70–99)
Potassium: 3.7 mmol/L (ref 3.5–5.1)
Sodium: 138 mmol/L (ref 135–145)
Total Bilirubin: 0.4 mg/dL (ref 0.3–1.2)
Total Protein: 7.9 g/dL (ref 6.5–8.1)

## 2022-11-30 LAB — URINE CULTURE: Special Requests: NORMAL

## 2022-11-30 LAB — LIPASE, BLOOD: Lipase: 27 U/L (ref 11–51)

## 2022-11-30 MED ORDER — ALUM & MAG HYDROXIDE-SIMETH 200-200-20 MG/5ML PO SUSP
15.0000 mL | Freq: Once | ORAL | Status: AC
  Administered 2022-11-30: 15 mL via ORAL
  Filled 2022-11-30: qty 30

## 2022-11-30 MED ORDER — FAMOTIDINE 20 MG PO TABS
20.0000 mg | ORAL_TABLET | Freq: Once | ORAL | Status: AC
  Administered 2022-11-30: 20 mg via ORAL
  Filled 2022-11-30: qty 1

## 2022-11-30 MED ORDER — IOHEXOL 350 MG/ML SOLN
100.0000 mL | Freq: Once | INTRAVENOUS | Status: AC | PRN
  Administered 2022-11-30: 60 mL via INTRAVENOUS

## 2022-11-30 MED ORDER — ONDANSETRON 4 MG PO TBDP
8.0000 mg | ORAL_TABLET | Freq: Once | ORAL | Status: AC
  Administered 2022-11-30: 8 mg via ORAL
  Filled 2022-11-30: qty 2

## 2022-11-30 NOTE — ED Triage Notes (Signed)
Pt returns, was seen at [facility] yesterday and after medications symptoms were resolved, went home, did not pick up prescriptions. Pain has returned.

## 2022-11-30 NOTE — Discharge Instructions (Addendum)
The workup today was overall reassuring.  CT imaging was negative for any acute abnormality attributable to your abdominal pain.  I suspect that part of your symptoms are likely related to GERD or reflux.  See information attached regarding this process.  Recommend picking up Pepcid that was prescribed yesterday as well as Bentyl and Zofran to use as needed.  These medications should help with your abdominal discomfort, nausea and vomiting.  Recommend follow-up with primary care for reassessment of your symptoms.  Please do not hesitate to return to emergency department the worrisome signs and symptoms we discussed become apparent.

## 2022-11-30 NOTE — ED Provider Notes (Signed)
[facility] Provider Note   CSN: [id number] Arrival date & time: 11/30/22  [id number]     History  No chief complaint on file.   Charlene Reed is a 27 y.o. female.  HPI   27 year old female presents to the emergency department with complaints of epigastric abdominal pain, nausea, vomiting.  Patient was seen yesterday at the emergency department for the same complaint.  Patient states that she did a chest and abdominal workout 3 to 4 days ago with persistent abdominal soreness since then.  2 days ago, she began to have acute onset epigastric pain with episodes of vomiting/feelings of nausea.  She states she went to the emergency department yesterday with resolution of symptoms with administration of Zofran along with GI cocktail.  Laboratory studies were reassuring at that time with no imaging of the abdomen or pelvis.  Patient states she went home after feeling back to baseline and states that pain and nausea returned.  She did not pick up her medications at the pharmacy and has not taken anything since pain returned.  She presents emergency department again for reassessment and requesting CT imaging.  Denies fever, chills, chest pain, shortness of breath, hematemesis, urinary/vaginal symptoms, change in bowel habits.  No significant pertinent past medical history.  Home Medications Prior to Admission medications   Medication Sig Start Date End Date Taking? Authorizing Provider  albuterol (VENTOLIN HFA) 108 (90 Base) MCG/ACT inhaler Inhale 1-2 puffs into the lungs every 6 (six) hours as needed for wheezing or shortness of breath. 09/25/22   Francene Finders, PA-C  amoxicillin (AMOXIL) 500 MG capsule Take 2 capsules by mouth now, then 1 capsule 3 times daily until gone. 07/31/22     chlorhexidine (PERIDEX) 0.12 % solution Risne 15 mLs by mouth in the morning after breakfast and at bedtime. 07/31/22     dicyclomine (BENTYL) 20 MG tablet Take 1 tablet (20  mg total) by mouth 2 (two) times daily. 11/29/22   Margarita Mail, PA-C  famotidine (PEPCID) 20 MG tablet Take 1 tablet (20 mg total) by mouth 2 (two) times daily. 11/29/22   Margarita Mail, PA-C  loperamide (IMODIUM) 2 MG capsule Take 1 capsule (2 mg total) by mouth 2 (two) times daily as needed for diarrhea or loose stools. 07/03/22   Jaynee Eagles, PA-C  Multiple Vitamin (MULTIVITAMIN) tablet Take 1 tablet by mouth daily.    [provider]  ondansetron (ZOFRAN) 4 MG tablet Take 1 tablet (4 mg total) by mouth every 8 (eight) hours as needed for nausea or vomiting. 11/29/22   Margarita Mail, PA-C  ondansetron (ZOFRAN-ODT) 8 MG disintegrating tablet Take 1 tablet (8 mg total) by mouth every 8 (eight) hours as needed for nausea or vomiting. 07/03/22   Jaynee Eagles, PA-C      Allergies    Patient has no known allergies.    Review of Systems   Review of Systems  All other systems reviewed and are negative.   Physical Exam Updated Vital Signs BP 113/68 (BP Location: Right Arm)   Pulse 88   Temp 98.2 F (36.8 C) (Oral)   Resp 16   LMP 11/18/2022   SpO2 100%  Physical Exam Vitals and nursing note reviewed.  Constitutional:      General: She is not in acute distress.    Appearance: She is well-developed.  HENT:     Head: Normocephalic and atraumatic.  Eyes:     Conjunctiva/sclera: Conjunctivae normal.  Cardiovascular:  Rate and Rhythm: Normal rate and regular rhythm.     Heart sounds: No murmur heard. Pulmonary:     Effort: Pulmonary effort is normal. No respiratory distress.     Breath sounds: Normal breath sounds.  Abdominal:     Palpations: Abdomen is soft.     Tenderness: There is abdominal tenderness. There is no right CVA tenderness or left CVA tenderness.     Comments: Epigastric tenderness to palpation.  Musculoskeletal:        General: No swelling.     Cervical back: Neck supple.     Right lower leg: No edema.     Left lower leg: No edema.  Skin:     General: Skin is warm and dry.     Capillary Refill: Capillary refill takes less than 2 seconds.  Neurological:     Mental Status: She is alert.  Psychiatric:        Mood and Affect: Mood normal.     ED Results / Procedures / Treatments   Labs (all labs ordered are listed, but only abnormal results are displayed) Labs Reviewed  CBC WITH DIFFERENTIAL/PLATELET - Abnormal; Notable for the following components:      Result Value   Hemoglobin 15.6 (*)    All other components within normal limits  URINALYSIS, ROUTINE W REFLEX MICROSCOPIC - Abnormal; Notable for the following components:   Protein, ur TRACE (*)    All other components within normal limits  COMPREHENSIVE METABOLIC PANEL  LIPASE, BLOOD    EKG None  Radiology CT Abdomen Pelvis W Contrast  Result Date: 11/30/2022 CLINICAL DATA:  Epigastric pain for 4 days EXAM: CT ABDOMEN AND PELVIS WITH CONTRAST TECHNIQUE: Multidetector CT imaging of the abdomen and pelvis was performed using the standard protocol following bolus administration of intravenous contrast. RADIATION DOSE REDUCTION: This exam was performed according to the departmental dose-optimization program which includes automated exposure control, adjustment of the mA and/or kV according to patient size and/or use of iterative reconstruction technique. CONTRAST:  49mL OMNIPAQUE IOHEXOL 350 MG/ML SOLN COMPARISON:  None Available. FINDINGS: Lower chest: No acute abnormality. Hepatobiliary: No solid liver abnormality is seen. No gallstones, gallbladder wall thickening, or biliary dilatation. Pancreas: Unremarkable. No pancreatic ductal dilatation or surrounding inflammatory changes. Spleen: Normal in size without significant abnormality. Adrenals/Urinary Tract: Adrenal glands are unremarkable. Kidneys are normal, without renal calculi, solid lesion, or hydronephrosis. Bladder is unremarkable. Stomach/Bowel: Stomach is within normal limits. Appendix appears normal. No evidence of  bowel wall thickening, distention, or inflammatory changes. Vascular/Lymphatic: No significant vascular findings are present. No enlarged abdominal or pelvic lymph nodes. Reproductive: No mass or other significant abnormality. Benign functional right ovarian cyst measuring 2.8 x 2.3 cm (series 2, image 62). No follow-up imaging recommended. Note: This recommendation does not apply to premenarchal patients and to those with increased risk (genetic, family history, elevated tumor markers or other high-risk factors) of ovarian cancer. Reference: JACR 2020 Feb; 17(2):248-254 Other: No abdominal wall hernia or abnormality. No ascites. Musculoskeletal: No acute or significant osseous findings. IMPRESSION: No acute CT findings of the abdomen or pelvis to explain epigastric pain. Electronically Signed   By: Delanna Ahmadi M.D.   On: 11/30/2022 12:03    Procedures Procedures    Medications Ordered in ED Medications  alum & mag hydroxide-simeth (MAALOX/MYLANTA) 200-200-20 MG/5ML suspension 15 mL (15 mLs Oral Given 11/30/22 0957)  ondansetron (ZOFRAN-ODT) disintegrating tablet 8 mg (8 mg Oral Given 11/30/22 0958)  famotidine (PEPCID) tablet 20 mg (20 mg  Oral Given 11/30/22 0958)  iohexol (OMNIPAQUE) 350 MG/ML injection 100 mL (60 mLs Intravenous Contrast Given 11/30/22 1133)    ED Course/ Medical Decision Making/ A&P                             Medical Decision Making Amount and/or Complexity of Data Reviewed Labs: ordered. Radiology: ordered.  Risk OTC drugs. Prescription drug management.   This patient presents to the ED for concern of abdominal pain, this involves an extensive number of treatment options, and is a complaint that carries with it a high risk of complications and morbidity.  The differential diagnosis includes gastritis, PUD, pancreatitis, CBD pathology, acute cholecystitis, hepatitis, SBO/LBO, volvulus, diverticulitis, appendicitis, pyelonephritis, nephrolithiasis, cystitis, ectopic  pregnancy, tubo-ovarian abscess, ovarian cyst, ovarian torsion, PID,   Co morbidities that complicate the patient evaluation  See HPI   Additional history obtained:  Additional history obtained from EMR External records from outside source obtained and reviewed including hospital records   Lab Tests:  I Ordered, and personally interpreted labs.  The pertinent results include: No leukocytosis noted.  No evidence of anemia.  Platelets within normal range.  No electrolyte abnormalities.  No transaminitis.  No renal dysfunction.  UA significant for trace protein but otherwise unremarkable.  Lipase within normal limits.   Imaging Studies ordered:  I ordered imaging studies including CT abdomen pelvis I independently visualized and interpreted imaging which showed no acute abnormalities in abdomen or pelvis I agree with the radiologist interpretation   Cardiac Monitoring: / EKG:  The patient was maintained on a cardiac monitor.  I personally viewed and interpreted the cardiac monitored which showed an underlying rhythm of: Sinus rhythm   Consultations Obtained:  N/a   Problem List / ED Course / Critical interventions / Medication management  Epigastric abdominal pain I ordered medication including Zofran, Pepcid, Maalox   Reevaluation of the patient after these medicines showed that the patient improved I have reviewed the patients home medicines and have made adjustments as needed   Social Determinants of Health:  Denies tobacco, licit drug use.   Test / Admission - Considered:  Epigastric abdominal pain Vitals signs within normal range and stable throughout visit. Laboratory/imaging studies significant for: See above 27 year old female presents emergency department with complaints of epigastric abdominal pain, nausea, vomiting.  Laboratory studies unremarkable for acute abnormality.  CT imaging of the abdomen/pelvis negative for any acute abnormalities.  Patient  overall well-appearing, afebrile in no acute distress tolerating p.o. without difficulty.  Patient noted significant improvement of symptoms with administration of GI cocktail as well as Zofran.  Likely secondary to gastritis versus PUD.  Patient recommended abstinence from medications/foods/liquids that exacerbate GERD as well as recommended to pick up prescription of reflux medication as well as Zofran prescribed yesterday.  Patient recommended follow-up with primary care for reassessment of symptoms.  Treatment plan discussed at length with patient and she acknowledged understanding was agreeable to said plan. Worrisome signs and symptoms were discussed with the patient, and the patient acknowledged understanding to return to the ED if noticed. Patient was stable upon discharge.          Final Clinical Impression(s) / ED Diagnoses Final diagnoses:  Epigastric abdominal pain    Rx / DC Orders ED Discharge Orders     None         Wilnette Kales, PA 11/30/22 1229    Lennice Sites, DO 11/30/22 1325

## 2022-11-30 NOTE — ED Notes (Signed)
Dc instructions reviewed with patient. Patient voiced understanding. Dc with belongings.  °

## 2022-12-06 DIAGNOSIS — R1013 Epigastric pain: Secondary | ICD-10-CM | POA: Diagnosis not present

## 2022-12-09 DIAGNOSIS — N390 Urinary tract infection, site not specified: Secondary | ICD-10-CM | POA: Diagnosis not present

## 2022-12-11 ENCOUNTER — Ambulatory Visit: Payer: BC Managed Care – PPO | Admitting: Family Medicine

## 2022-12-11 ENCOUNTER — Encounter: Payer: Self-pay | Admitting: Family Medicine

## 2022-12-11 VITALS — BP 123/65 | HR 68 | Ht 60.0 in | Wt 130.0 lb

## 2022-12-11 DIAGNOSIS — Z01818 Encounter for other preprocedural examination: Secondary | ICD-10-CM | POA: Diagnosis not present

## 2022-12-11 NOTE — Progress Notes (Signed)
Subjective:      HPI: Pt is a 27 y.o. female who is here for preoperative clearance for liposuction with fat transfer to the buttocks [facility]). Planning for next month.   1) High Risk Cardiac Conditions:  1) Recent MI - No.  2) Decompensated Heart Failure - No.  3) Unstable angina - No.  4) Symptomatic arrythmia - No.  5) Sx Valvular Disease - No.  2) Intermediate Risk Factors: DM, CKD, CVA, CHF, CAD - No.  2) Functional Status: > 4 mets (Walk, run, climb stairs) Yes.  Charlene Reed Activity Status Index: 58.2  3) Surgery Specific Risk: High (Emergency, Vascular, Intra-abdominal, Extensive ops)          Intermediate (Carotid, Head and Neck, Orthopaedic )          Low (Endoscopic, Cataract, Breast )  4) Further Noninvasive evaluation:   1) EKG - Yes.  , NSR 63 bpm, no ST changes or signs of ischemia  2) Echo - No.  3) Stress Testing - Active Cardiac Disease - No.  4) CXR No.  5)PFTs No.     5) Need for medical therapy - Beta Blocker, Statins indicated ? No.  New complaints: No new complaints.   Social history:  Relevant past medical, surgical, family and social history reviewed and updated as indicated. Interim medical history since our last visit reviewed.  Allergies and medications reviewed and updated.  DATA REVIEWED: CHART IN EPIC  ROS: Negative unless specifically indicated above in HPI.    Current Outpatient Medications:    Multiple Vitamin (MULTIVITAMIN) tablet, Take 1 tablet by mouth daily., Disp: , Rfl:       Objective:    BP 123/65   Pulse 68   Ht 5' (1.524 m)   Wt 130 lb (59 kg)   LMP 11/18/2022   SpO2 100%   BMI 25.39 kg/m   Wt Readings from Last 3 Encounters:  12/11/22 130 lb (59 kg)  11/29/22 130 lb (59 kg)  07/17/22 134 lb 9.6 oz (61.1 kg)    Physical Exam Vitals reviewed.  Constitutional:      Appearance: Normal appearance.  Cardiovascular:     Rate and Rhythm: Normal rate and regular rhythm.     Pulses:  Normal pulses.     Heart sounds: Normal heart sounds. No murmur heard. Pulmonary:     Effort: Pulmonary effort is normal.     Breath sounds: Normal breath sounds. No wheezing, rhonchi or rales.  Musculoskeletal:     Cervical back: Normal range of motion and neck supple. No tenderness.     Right lower leg: No edema.     Left lower leg: No edema.  Lymphadenopathy:     Cervical: No cervical adenopathy.  Skin:    General: Skin is warm and dry.  Neurological:     Mental Status: She is alert and oriented to person, place, and time.  Psychiatric:        Mood and Affect: Mood normal.        Behavior: Behavior normal.        Thought Content: Thought content normal.        Judgment: Judgment normal.         Assessment & Plan:  Preop testing -     EKG 12-Lead -     CBC with Differential/Platelet; Future -     Comprehensive metabolic panel; Future -     Protime-INR; Future -  APTT; Future -     HIV Antibody (routine testing w rflx); Future -     hCG, quantitative, pregnancy; Future -     Hemoglobin A1c; Future -     Iron, TIBC and Ferritin Panel; Future -     Urinalysis; Future    I have independently evaluated patient.  Charlene Reed is a 27 y.o. female who is low risk for a low-intermediate risk surgery.  There are not modifiable risk factors. Scheryl Marten Sapia's RCRI/NSQIP calculation for MACE is: 0.     Follow-up as needed.  Lollie Marrow Reola Calkins, DNP, FNP-C

## 2022-12-12 ENCOUNTER — Other Ambulatory Visit (INDEPENDENT_AMBULATORY_CARE_PROVIDER_SITE_OTHER): Payer: BC Managed Care – PPO

## 2022-12-12 DIAGNOSIS — Z01818 Encounter for other preprocedural examination: Secondary | ICD-10-CM | POA: Diagnosis not present

## 2022-12-12 LAB — URINALYSIS
Bilirubin Urine: NEGATIVE
Hgb urine dipstick: NEGATIVE
Ketones, ur: NEGATIVE
Leukocytes,Ua: NEGATIVE
Nitrite: NEGATIVE
Specific Gravity, Urine: <=1.005 — AB (ref 1.000–1.030)
Total Protein, Urine: NEGATIVE
Urine Glucose: NEGATIVE
Urobilinogen, UA: 0.2 (ref 0.0–1.0)
pH: 6 (ref 5.0–8.0)

## 2022-12-12 LAB — CBC WITH DIFFERENTIAL/PLATELET
Basophils Absolute: 0 10*3/uL (ref 0.0–0.1)
Basophils Relative: 0.9 % (ref 0.0–3.0)
Eosinophils Absolute: 0.3 10*3/uL (ref 0.0–0.7)
Eosinophils Relative: 5.5 % — ABNORMAL HIGH (ref 0.0–5.0)
HCT: 42.3 % (ref 36.0–46.0)
Hemoglobin: 14.2 g/dL (ref 12.0–15.0)
Lymphocytes Relative: 33.5 % (ref 12.0–46.0)
Lymphs Abs: 1.9 10*3/uL (ref 0.7–4.0)
MCHC: 33.7 g/dL (ref 30.0–36.0)
MCV: 96.1 fl (ref 78.0–100.0)
Monocytes Absolute: 0.6 10*3/uL (ref 0.1–1.0)
Monocytes Relative: 10.9 % (ref 3.0–12.0)
Neutro Abs: 2.7 10*3/uL (ref 1.4–7.7)
Neutrophils Relative %: 49.2 % (ref 43.0–77.0)
Platelets: 224 10*3/uL (ref 150.0–400.0)
RBC: 4.41 Mil/uL (ref 3.87–5.11)
RDW: 13.6 % (ref 11.5–15.5)
WBC: 5.5 10*3/uL (ref 4.0–10.5)

## 2022-12-12 LAB — PROTIME-INR
INR: 1 ratio (ref 0.8–1.0)
Prothrombin Time: 10.2 s (ref 9.6–13.1)

## 2022-12-12 LAB — HEMOGLOBIN A1C: Hgb A1c MFr Bld: 5.2 % (ref 4.6–6.5)

## 2022-12-12 LAB — COMPREHENSIVE METABOLIC PANEL
ALT: 34 U/L (ref 0–35)
AST: 32 U/L (ref 0–37)
Albumin: 4.6 g/dL (ref 3.5–5.2)
Alkaline Phosphatase: 62 U/L (ref 39–117)
BUN: 12 mg/dL (ref 6–23)
CO2: 26 mEq/L (ref 19–32)
Calcium: 9.6 mg/dL (ref 8.4–10.5)
Chloride: 102 mEq/L (ref 96–112)
Creatinine, Ser: 0.74 mg/dL (ref 0.40–1.20)
GFR: 109.84 mL/min (ref >60.00)
Glucose, Bld: 86 mg/dL (ref 70–99)
Potassium: 3.9 mEq/L (ref 3.5–5.1)
Sodium: 137 mEq/L (ref 135–145)
Total Bilirubin: 0.4 mg/dL (ref 0.2–1.2)
Total Protein: 7.3 g/dL (ref 6.0–8.3)

## 2022-12-12 LAB — HCG, QUANTITATIVE, PREGNANCY: Quantitative HCG: <0.6 m[IU]/mL

## 2022-12-12 LAB — APTT: aPTT: 29.2 s (ref 25.4–36.8)

## 2022-12-13 ENCOUNTER — Encounter: Payer: Self-pay | Admitting: Family Medicine

## 2022-12-13 LAB — HIV ANTIBODY (ROUTINE TESTING W REFLEX): HIV 1&2 Ab, 4th Generation: NONREACTIVE

## 2022-12-13 LAB — IRON,TIBC AND FERRITIN PANEL
%SAT: 39 % (calc) (ref 16–45)
Ferritin: 74 ng/mL (ref 16–154)
Iron: 143 ug/dL (ref 40–190)
TIBC: 369 mcg/dL (calc) (ref 250–450)

## 2022-12-15 ENCOUNTER — Other Ambulatory Visit: Payer: BC Managed Care – PPO

## 2022-12-30 ENCOUNTER — Ambulatory Visit: Payer: BC Managed Care – PPO | Admitting: Family Medicine

## 2022-12-30 ENCOUNTER — Encounter: Payer: Self-pay | Admitting: Family Medicine

## 2022-12-30 VITALS — BP 122/49 | HR 65 | Ht 60.0 in | Wt 134.0 lb

## 2022-12-30 DIAGNOSIS — L739 Follicular disorder, unspecified: Secondary | ICD-10-CM

## 2022-12-30 MED ORDER — CEPHALEXIN 500 MG PO CAPS
500.0000 mg | ORAL_CAPSULE | Freq: Four times a day (QID) | ORAL | 0 refills | Status: AC
Start: 2022-12-30 — End: 2023-01-04

## 2022-12-30 NOTE — Patient Instructions (Signed)
Start with antibiotics (Keflex) for folliculitis.  Warm compresses several times a day. Keep area clean.  After infection resolves, focus on topical acne treatment like we discussed (benzoyl peroxide and salicylic acid products).

## 2022-12-30 NOTE — Progress Notes (Signed)
   Acute Office Visit  Subjective:     Patient ID: Charlene Reed, female    DOB: 1996/06/17, 27 y.o.   MRN: [id number]  Chief Complaint  Patient presents with   Rash    Patient is in today for rash.   Patient reports about 2 months ago she started noticing some mild acne to her upper back. She never tried any treatment at home. About two weeks ago she forgot the bumps were there and she shaved the region because she was going to be wearing a dress. States that since then the area has looked worse and over the past few days a few of the lesions have gotten a little more red and tender. She has not noticed any drainage or abscess. She started putting OTC antibiotic ointment last night. Denies any other rashes, edema.      All review of systems negative except what is listed in the HPI      Objective:    BP (!) 122/49   Pulse 65   Ht 5' (1.524 m)   Wt 134 lb (60.8 kg)   SpO2 100%   BMI 26.17 kg/m    Physical Exam Vitals reviewed.  Constitutional:      Appearance: Normal appearance.  Skin:    General: Skin is warm and dry.     Comments: Upper back with folliculitis and acne, no abscesses, induration, spreading erythema, warmth, or drainage  Neurological:     General: No focal deficit present.     Mental Status: She is alert and oriented to person, place, and time. Mental status is at baseline.  Psychiatric:        Mood and Affect: Mood normal.        Behavior: Behavior normal.        Thought Content: Thought content normal.        Judgment: Judgment normal.     No results found for any visits on 12/30/22.      Assessment & Plan:   Problem List Items Addressed This Visit   None Visit Diagnoses     Folliculitis    -  Primary Start with antibiotics (Keflex) for folliculitis.  Warm compresses several times a day. Keep area clean.  After infection resolves, focus on topical acne treatment like we discussed (benzoyl peroxide and salicylic acid  products). Patient aware of signs/symptoms requiring further/urgent evaluation.     Relevant Medications   cephALEXin (KEFLEX) 500 MG capsule       Meds ordered this encounter  Medications   cephALEXin (KEFLEX) 500 MG capsule    Sig: Take 1 capsule (500 mg total) by mouth 4 (four) times daily for 5 days.    Dispense:  20 capsule    Refill:  0    Order Specific Question:   Supervising Provider    Answer:   Danise Edge A [[id number]]    Return if symptoms worsen or fail to improve.  Clayborne Dana, NP

## 2023-01-12 ENCOUNTER — Encounter: Payer: BC Managed Care – PPO | Admitting: Family Medicine

## 2023-01-21 DIAGNOSIS — Z6830 Body mass index (BMI) 30.0-30.9, adult: Secondary | ICD-10-CM | POA: Diagnosis not present

## 2023-01-21 DIAGNOSIS — R635 Abnormal weight gain: Secondary | ICD-10-CM | POA: Diagnosis not present

## 2023-01-29 DIAGNOSIS — Z111 Encounter for screening for respiratory tuberculosis: Secondary | ICD-10-CM | POA: Diagnosis not present

## 2023-02-19 DIAGNOSIS — L309 Dermatitis, unspecified: Secondary | ICD-10-CM | POA: Diagnosis not present

## 2023-02-23 ENCOUNTER — Ambulatory Visit: Payer: BC Managed Care – PPO | Admitting: Family Medicine

## 2023-02-24 ENCOUNTER — Encounter: Payer: Self-pay | Admitting: Family Medicine

## 2023-02-24 ENCOUNTER — Ambulatory Visit: Payer: BC Managed Care – PPO | Admitting: Family Medicine

## 2023-02-24 VITALS — BP 112/75 | HR 68 | Ht 60.0 in | Wt 137.0 lb

## 2023-02-24 DIAGNOSIS — L282 Other prurigo: Secondary | ICD-10-CM | POA: Diagnosis not present

## 2023-02-24 MED ORDER — PREDNISONE 20 MG PO TABS
40.0000 mg | ORAL_TABLET | Freq: Every day | ORAL | 0 refills | Status: AC
Start: 2023-02-24 — End: 2023-03-01

## 2023-02-24 NOTE — Progress Notes (Signed)
Acute Office Visit  Subjective:     Patient ID: Charlene Reed, female    DOB: 1996-01-07, 27 y.o.   MRN: [id number]  Chief Complaint  Patient presents with   Rash     Patient is in today for rashes.  Discussed the use of AI scribe software for clinical note transcription with the patient, who gave verbal consent to proceed.  History of Present Illness   The patient presents with a two-month history of a growing, itchy, red rash on her leg. The rash started small and has progressively enlarged. She also developed a similar rash on her side and a rash of small red dots on her abdomen. The abdominal rash started small and has been spreading over the past week and a half. The rashes are itchy but not painful. She denies any new diet, medications, or skin care products. She also denies fever, trouble breathing, and wheezing. States she had this evaluated at student health and they did a skin scrape to ensure no fungal infection. She has been using triamcinolone cream with improvement in itching, but rash is not going away.               All review of systems negative except what is listed in the HPI      Objective:    BP 112/75   Pulse 68   Ht 5' (1.524 m)   Wt 137 lb (62.1 kg)   SpO2 100%   BMI 26.76 kg/m    Physical Exam Vitals reviewed.  Constitutional:      Appearance: Normal appearance.  Musculoskeletal:     Right lower leg: No edema.     Left lower leg: No edema.  Skin:    Comments: Right lateral leg ~5 cm diameter red, dry, mildly raised Abdomen with pinpoint rash, no spread to extremities or back   Neurological:     Mental Status: She is alert.  Psychiatric:        Mood and Affect: Mood normal.        Behavior: Behavior normal.        Thought Content: Thought content normal.        Judgment: Judgment normal.        No results found for any visits on 02/24/23.      Assessment & Plan:   Problem List Items Addressed This Visit    None Visit Diagnoses     Pruritic rash    -  Primary   Relevant Medications   predniSONE (DELTASONE) 20 MG tablet      Undifferentiated Rash: Two separate rashes present for approximately two months and one week respectively. The first rash on the leg was initially suspected to be ringworm, but a skin scrape was negative for fungus. The second rash on the abdomen is itchy and has been spreading. No new diet, medications, or skin care products. No systemic symptoms. -Continue topical treatments with Triamcinolone and Lotrimin twice daily. -Start Prednisone for five days to reduce itching and inflammation. -Attempt to schedule an earlier dermatology appointment with [facility] if in network - patient to call them. -Return for follow-up if Prednisone is ineffective and dermatology appointment cannot be expedited.        Meds ordered this encounter  Medications   predniSONE (DELTASONE) 20 MG tablet    Sig: Take 2 tablets (40 mg total) by mouth daily with breakfast for 5 days.    Dispense:  10 tablet    Refill:  0    Order Specific Question:   Supervising Provider    Answer:   Danise Edge A [[id number]]    Return if symptoms worsen or fail to improve.  Clayborne Dana, NP

## 2023-03-02 DIAGNOSIS — L700 Acne vulgaris: Secondary | ICD-10-CM | POA: Diagnosis not present

## 2023-03-02 DIAGNOSIS — L308 Other specified dermatitis: Secondary | ICD-10-CM | POA: Diagnosis not present

## 2023-04-23 ENCOUNTER — Encounter: Payer: BC Managed Care – PPO | Admitting: Family Medicine

## 2023-05-25 DIAGNOSIS — Z01411 Encounter for gynecological examination (general) (routine) with abnormal findings: Secondary | ICD-10-CM | POA: Diagnosis not present

## 2023-05-25 DIAGNOSIS — E559 Vitamin D deficiency, unspecified: Secondary | ICD-10-CM | POA: Diagnosis not present

## 2023-05-25 DIAGNOSIS — Z202 Contact with and (suspected) exposure to infections with a predominantly sexual mode of transmission: Secondary | ICD-10-CM | POA: Diagnosis not present

## 2023-05-25 DIAGNOSIS — Z13228 Encounter for screening for other metabolic disorders: Secondary | ICD-10-CM | POA: Diagnosis not present

## 2023-05-25 DIAGNOSIS — Z23 Encounter for immunization: Secondary | ICD-10-CM | POA: Diagnosis not present

## 2023-05-25 DIAGNOSIS — Z1321 Encounter for screening for nutritional disorder: Secondary | ICD-10-CM | POA: Diagnosis not present

## 2023-05-25 DIAGNOSIS — R5383 Other fatigue: Secondary | ICD-10-CM | POA: Diagnosis not present

## 2023-05-25 DIAGNOSIS — F418 Other specified anxiety disorders: Secondary | ICD-10-CM | POA: Diagnosis not present

## 2023-05-25 DIAGNOSIS — R87612 Low grade squamous intraepithelial lesion on cytologic smear of cervix (LGSIL): Secondary | ICD-10-CM | POA: Diagnosis not present

## 2023-05-25 DIAGNOSIS — Z130 Encounter for screening for diseases of the blood and blood-forming organs and certain disorders involving the immune mechanism: Secondary | ICD-10-CM | POA: Diagnosis not present

## 2023-05-25 DIAGNOSIS — Z113 Encounter for screening for infections with a predominantly sexual mode of transmission: Secondary | ICD-10-CM | POA: Diagnosis not present

## 2023-05-26 ENCOUNTER — Ambulatory Visit: Payer: BC Managed Care – PPO | Admitting: Family

## 2023-05-26 ENCOUNTER — Encounter: Payer: Self-pay | Admitting: Family

## 2023-05-26 VITALS — BP 116/68 | HR 70 | Temp 99.2°F | Resp 16 | Wt 129.0 lb

## 2023-05-26 DIAGNOSIS — F418 Other specified anxiety disorders: Secondary | ICD-10-CM

## 2023-05-26 DIAGNOSIS — R7989 Other specified abnormal findings of blood chemistry: Secondary | ICD-10-CM

## 2023-05-26 DIAGNOSIS — R011 Cardiac murmur, unspecified: Secondary | ICD-10-CM | POA: Diagnosis not present

## 2023-05-26 DIAGNOSIS — F419 Anxiety disorder, unspecified: Secondary | ICD-10-CM

## 2023-05-26 MED ORDER — PROPRANOLOL HCL 10 MG PO TABS: ORAL_TABLET | ORAL | 1 refills | Status: DC

## 2023-05-26 MED ORDER — SERTRALINE HCL 50 MG PO TABS: ORAL_TABLET | ORAL | 0 refills | Status: DC

## 2023-05-26 NOTE — Assessment & Plan Note (Signed)
Recent elevation in liver function tests noted on routine labs. No symptoms of liver disease. Patient recently started Ashwagandha supplement.  -Discontinue Ashwagandha.  -Repeat liver function tests today.  -Repeat liver function tests in two weeks if not improved.  -Order acute hepatitis panel

## 2023-05-26 NOTE — Assessment & Plan Note (Signed)
Uncontrolled. Will initiate sertraline 50mg . I instructed pt to start 1/2 tablet once daily for 1 week and then increase to a full tablet once daily on week two as tolerated.  We discussed common side effects such as nausea, drowsiness.

## 2023-05-26 NOTE — Assessment & Plan Note (Signed)
Rx provided for prn propranolol to be used prior to her school presentations.

## 2023-05-26 NOTE — Assessment & Plan Note (Signed)
New soft murmur noted on physical exam. No known history of heart disease. -Order baseline echocardiogram to assess valves.

## 2023-05-26 NOTE — Patient Instructions (Signed)
VISIT SUMMARY:  During your visit, we discussed your elevated liver function tests, anxiety, and a heart murmur that was noted during your physical exam. You reported no GI symptoms and you have been experiencing increased anxiety due to academic stressors.   YOUR PLAN:  -ELEVATED LIVER FUNCTION TESTS:  We suspect this might be due to the Ashwagandha supplement you've been taking. We've asked you to stop taking this supplement and we'll repeat the liver function tests today and in two weeks. We'll also run a routine hepatitis panel for thoroughness.  -ANXIETY: You've been experiencing increased anxiety, particularly in social situations and during presentations. We're starting you on a medication called Sertraline, which can help reduce your anxiety. You'll start with a half tablet at bedtime for one week, then increase to a full tablet. We're also providing Propranolol, which you can take 30-60 minutes before presentations to help manage your anxiety.  -HEART MURMUR: A soft heart murmur was detected during your physical exam. This is a sound made by the blood circulating through your heart's chambers and valves, or through blood vessels near your heart. We're ordering a baseline echocardiogram, which is a type of ultrasound, to assess your heart valves.  -GENERAL HEALTH MAINTENANCE: You received your flu vaccine on 05/25/2023 and we've provided an updated immunization record for your school documentation.  INSTRUCTIONS:  Please discontinue the use of Ashwagandha and return to the clinic for repeat liver function tests today and in two weeks. Start taking Sertraline, half a tablet at bedtime for one week, then increase to a full tablet. Take Propranolol 30-60 minutes before presentations. We will also schedule an echocardiogram to assess your heart valves.

## 2023-05-26 NOTE — Progress Notes (Signed)
Subjective:     Patient ID: Charlene Reed, female    DOB: 1996-03-28, 27 y.o.   MRN: [id number]  Chief Complaint  Patient presents with   Follow-up    To go over abnormal labs from GYN     HPI  Discussed the use of AI scribe software for clinical note transcription with the patient, who gave verbal consent to proceed.  History of Present Illness   The patient, presents for evaluation of elevated liver function tests (LFTs) identified on routine lab work performed by their gynecologist yesterday. She reports no symptoms such as nausea, vomiting, or abdominal pain. She admits to social drinking on weekends and recently started taking Ashwagandha for anxiety. She denies any significant weight changes.  In addition to the elevated LFTs, the patient has been experiencing increased anxiety, particularly in social situations and during presentations. This has resulted in sleep disturbances and physical symptoms such as shakiness. It is bothering her much of the day. She has never been on any medication for anxiety or depression before.          Health Maintenance Due  Topic Date Due   COVID-19 Vaccine (1 - 2023-24 season) Never done    No past medical history on file.  No past surgical history on file.  Family History  Problem Relation Age of Onset   Heart disease Father    Heart attack Father    High blood pressure Father    Breast cancer Maternal Grandmother     Social History   Socioeconomic History   Marital status: Single    Spouse name: Not on file   Number of children: Not on file   Years of education: Not on file   Highest education level: Bachelor's degree (e.g., BA, AB, BS)  Occupational History   Not on file  Tobacco Use   Smoking status: Never   Smokeless tobacco: Never  Vaping Use   Vaping status: Never Used  Substance and Sexual Activity   Alcohol use: Yes    Alcohol/week: 6.0 standard drinks of alcohol    Types: 6 Shots of liquor per week   Drug  use: Never   Sexual activity: Yes    Partners: Male    Birth control/protection: Condom  Other Topics Concern   Not on file  Social History Narrative   Not on file   Social Determinants of Health   Financial Resource Strain: Low Risk  (12/10/2022)   Overall Financial Resource Strain (CARDIA)    Difficulty of Paying Living Expenses: Not hard at all  Food Insecurity: No Food Insecurity (12/10/2022)   Hunger Vital Sign    Worried About Running Out of Food in the Last Year: Never true    Ran Out of Food in the Last Year: Never true  Transportation Needs: No Transportation Needs (12/10/2022)   PRAPARE - Administrator, Civil Service (Medical): No    Lack of Transportation (Non-Medical): No  Physical Activity: Sufficiently Active (12/10/2022)   Exercise Vital Sign    Days of Exercise per Week: 5 days    Minutes of Exercise per Session: 60 min  Stress: No Stress Concern Present (12/10/2022)   [employer] of Occupational Health - Occupational Stress Questionnaire    Feeling of Stress : Only a little  Social Connections: Moderately Integrated (12/10/2022)   Social Connection and Isolation Panel [NHANES]    Frequency of Communication with Friends and Family: More than three times a week  Frequency of Social Gatherings with Friends and Family: More than three times a week    Attends Religious Services: 1 to 4 times per year    Active Member of [employer] or Organizations: Yes    Attends Banker Meetings: 1 to 4 times per year    Marital Status: Never married  Intimate Partner Violence: Unknown (01/16/2022)   Received from [employer], [facility]   HITS    Physically Hurt: Not on file    Insult or Talk Down To: Not on file    Threaten Physical Harm: Not on file    Scream or Curse: Not on file    Outpatient Medications Prior to Visit  Medication Sig Dispense Refill   Multiple Vitamin (MULTIVITAMIN) tablet Take 1 tablet by mouth daily.     triamcinolone  cream (KENALOG) 0.1 % SMARTSIG:1 Application Topical 2-3 Times Daily     No facility-administered medications prior to visit.    Allergies  Allergen Reactions   Tretinoin Dermatitis and Rash    ROS See HPI    Objective:    Physical Exam Constitutional:      Appearance: Normal appearance.  Eyes:     General: No scleral icterus. Cardiovascular:     Rate and Rhythm: Normal rate.     Heart sounds: Murmur heard.     Systolic murmur is present with a grade of 2/6.  Pulmonary:     Effort: Pulmonary effort is normal.     Breath sounds: Normal breath sounds.  Abdominal:     General: Bowel sounds are normal.     Palpations: Abdomen is soft. There is no hepatomegaly or splenomegaly.     Tenderness: There is no abdominal tenderness.  Neurological:     Mental Status: She is alert.  Psychiatric:        Attention and Perception: Attention normal.        Mood and Affect: Mood is anxious.        Speech: Speech normal.        Behavior: Behavior normal.        Cognition and Memory: Cognition and memory normal.        Judgment: Judgment normal.      BP 116/68   Pulse 70   Temp 99.2 F (37.3 C) (Oral)   Resp 16   Wt 129 lb (58.5 kg)   SpO2 100%   BMI 25.19 kg/m  Wt Readings from Last 3 Encounters:  05/26/23 129 lb (58.5 kg)  02/24/23 137 lb (62.1 kg)  12/30/22 134 lb (60.8 kg)       Assessment & Plan:   Problem List Items Addressed This Visit       Unprioritized   Performance anxiety    Rx provided for prn propranolol to be used prior to her school presentations.       Relevant Medications   sertraline (ZOLOFT) 50 MG tablet   Murmur    New soft murmur noted on physical exam. No known history of heart disease. -Order baseline echocardiogram to assess valves.       Relevant Orders   ECHOCARDIOGRAM COMPLETE   Elevated LFTs - Primary    Recent elevation in liver function tests noted on routine labs. No symptoms of liver disease. Patient recently started  Ashwagandha supplement.  -Discontinue Ashwagandha.  -Repeat liver function tests today.  -Repeat liver function tests in two weeks if not improved.  -Order acute hepatitis panel       Relevant Orders  Hepatic function panel   Hepatitis, Acute   Anxiety    Uncontrolled. Will initiate sertraline 50mg . I instructed pt to start 1/2 tablet once daily for 1 week and then increase to a full tablet once daily on week two as tolerated.  We discussed common side effects such as nausea, drowsiness.          Relevant Medications   sertraline (ZOLOFT) 50 MG tablet    I am having Rendy Y. Morriss start on sertraline and propranolol. I am also having her maintain her multivitamin and triamcinolone cream.  Meds ordered this encounter  Medications   sertraline (ZOLOFT) 50 MG tablet    Sig: 1/2 tab once daily at bedtime x 1 week, then increase to a full tab once daily on week two    Dispense:  30 tablet    Refill:  0    Order Specific Question:   Supervising Provider    Answer:   Danise Edge A [[id number]]   propranolol (INDERAL) 10 MG tablet    Sig: Take 1-2 tablets by mouth 60 minutes prior to presentation    Dispense:  20 tablet    Refill:  1    Order Specific Question:   Supervising Provider    Answer:   Danise Edge A [[id number]]

## 2023-05-27 LAB — HEPATITIS PANEL, ACUTE
Hep A IgM: NONREACTIVE
Hep B C IgM: NONREACTIVE
Hepatitis B Surface Ag: NONREACTIVE
Hepatitis C Ab: NONREACTIVE

## 2023-05-27 LAB — HEPATIC FUNCTION PANEL
ALT: 104 U/L — ABNORMAL HIGH (ref 0–35)
AST: 106 U/L — ABNORMAL HIGH (ref 0–37)
Albumin: 4.2 g/dL (ref 3.5–5.2)
Alkaline Phosphatase: 53 U/L (ref 39–117)
Bilirubin, Direct: 0.1 mg/dL (ref 0.0–0.3)
Total Bilirubin: 0.3 mg/dL (ref 0.2–1.2)
Total Protein: 6.7 g/dL (ref 6.0–8.3)

## 2023-05-28 ENCOUNTER — Ambulatory Visit: Payer: BC Managed Care – PPO | Admitting: Family Medicine

## 2023-05-28 DIAGNOSIS — F4011 Social phobia, generalized: Secondary | ICD-10-CM | POA: Diagnosis not present

## 2023-05-28 DIAGNOSIS — F410 Panic disorder [episodic paroxysmal anxiety] without agoraphobia: Secondary | ICD-10-CM | POA: Diagnosis not present

## 2023-05-28 DIAGNOSIS — F332 Major depressive disorder, recurrent severe without psychotic features: Secondary | ICD-10-CM | POA: Diagnosis not present

## 2023-05-28 DIAGNOSIS — F411 Generalized anxiety disorder: Secondary | ICD-10-CM | POA: Diagnosis not present

## 2023-06-04 DIAGNOSIS — F4011 Social phobia, generalized: Secondary | ICD-10-CM | POA: Diagnosis not present

## 2023-06-04 DIAGNOSIS — F40248 Other situational type phobia: Secondary | ICD-10-CM | POA: Diagnosis not present

## 2023-06-04 DIAGNOSIS — F410 Panic disorder [episodic paroxysmal anxiety] without agoraphobia: Secondary | ICD-10-CM | POA: Diagnosis not present

## 2023-06-04 DIAGNOSIS — F332 Major depressive disorder, recurrent severe without psychotic features: Secondary | ICD-10-CM | POA: Diagnosis not present

## 2023-06-18 ENCOUNTER — Ambulatory Visit (HOSPITAL_BASED_OUTPATIENT_CLINIC_OR_DEPARTMENT_OTHER): Payer: BC Managed Care – PPO

## 2023-06-19 DIAGNOSIS — F40248 Other situational type phobia: Secondary | ICD-10-CM | POA: Diagnosis not present

## 2023-06-19 DIAGNOSIS — F331 Major depressive disorder, recurrent, moderate: Secondary | ICD-10-CM | POA: Diagnosis not present

## 2023-06-19 DIAGNOSIS — F411 Generalized anxiety disorder: Secondary | ICD-10-CM | POA: Diagnosis not present

## 2023-06-19 DIAGNOSIS — F4011 Social phobia, generalized: Secondary | ICD-10-CM | POA: Diagnosis not present

## 2023-07-01 ENCOUNTER — Emergency Department (EMERGENCY_DEPARTMENT_HOSPITAL)
Admission: EM | Admit: 2023-07-01 | Discharge: 2023-07-02 | Disposition: A | Discharge status: Other Acute Inpatient Hospital | Payer: BC Managed Care – PPO | Source: Home / Self Care | Attending: Emergency Medicine | Admitting: Emergency Medicine

## 2023-07-01 ENCOUNTER — Other Ambulatory Visit: Payer: Self-pay

## 2023-07-01 DIAGNOSIS — Z803 Family history of malignant neoplasm of breast: Secondary | ICD-10-CM | POA: Diagnosis not present

## 2023-07-01 DIAGNOSIS — F332 Major depressive disorder, recurrent severe without psychotic features: Secondary | ICD-10-CM | POA: Diagnosis not present

## 2023-07-01 DIAGNOSIS — Z634 Disappearance and death of family member: Secondary | ICD-10-CM | POA: Diagnosis not present

## 2023-07-01 DIAGNOSIS — F4323 Adjustment disorder with mixed anxiety and depressed mood: Secondary | ICD-10-CM | POA: Diagnosis not present

## 2023-07-01 DIAGNOSIS — F10220 Alcohol dependence with intoxication, uncomplicated: Secondary | ICD-10-CM | POA: Insufficient documentation

## 2023-07-01 DIAGNOSIS — Z8249 Family history of ischemic heart disease and other diseases of the circulatory system: Secondary | ICD-10-CM | POA: Diagnosis not present

## 2023-07-01 DIAGNOSIS — R45851 Suicidal ideations: Secondary | ICD-10-CM

## 2023-07-01 DIAGNOSIS — F10129 Alcohol abuse with intoxication, unspecified: Secondary | ICD-10-CM | POA: Diagnosis not present

## 2023-07-01 DIAGNOSIS — R9431 Abnormal electrocardiogram [ECG] [EKG]: Secondary | ICD-10-CM | POA: Diagnosis not present

## 2023-07-01 DIAGNOSIS — Y908 Blood alcohol level of 240 mg/100 ml or more: Secondary | ICD-10-CM | POA: Diagnosis not present

## 2023-07-01 DIAGNOSIS — D751 Secondary polycythemia: Secondary | ICD-10-CM | POA: Diagnosis not present

## 2023-07-01 DIAGNOSIS — Z79899 Other long term (current) drug therapy: Secondary | ICD-10-CM | POA: Diagnosis not present

## 2023-07-01 DIAGNOSIS — Z888 Allergy status to other drugs, medicaments and biological substances status: Secondary | ICD-10-CM | POA: Diagnosis not present

## 2023-07-01 DIAGNOSIS — F4321 Adjustment disorder with depressed mood: Secondary | ICD-10-CM

## 2023-07-01 DIAGNOSIS — F10229 Alcohol dependence with intoxication, unspecified: Secondary | ICD-10-CM | POA: Diagnosis not present

## 2023-07-01 DIAGNOSIS — R7989 Other specified abnormal findings of blood chemistry: Secondary | ICD-10-CM | POA: Diagnosis not present

## 2023-07-01 NOTE — ED Notes (Signed)
Pt wanded by security. Pt still in personal clothing att. Per EMS, pt voluntary

## 2023-07-01 NOTE — ED Triage Notes (Addendum)
Pt BIB [facility] from home with SI.Endorsed wanting to kill herself, but no physical plan. Per medics Pt states that she took some Propanol but amount unknown. Per medic it was only (4) 10mg  pills missing from bottle. ETOH possibly onboard 3 empty containers of sex [place] noticed by Medics.

## 2023-07-01 NOTE — ED Notes (Signed)
Upon rounding to obtain blood specimens Pt not found in [facility] bed. All locations checked with in the ED but Pt unable to be located.Charge, Providers,&  [employer]/Security made aware. Pt has family members(boyfriend & brother) that were waiting in the lobby and have been made aware.

## 2023-07-01 NOTE — ED Provider Notes (Signed)
[place] EMERGENCY DEPARTMENT AT [facility] Provider Note   CSN: [id number] Arrival date & time: 07/01/23  2220     History  Chief Complaint  Patient presents with   Suicidal    Charlene Reed is a 27 y.o. female.  The history is provided by the patient and medical records.   27 year old female presenting to the ED with SI.  Apparently voiced wanting to kill herself.  She took some propranolol at unknown time, medics report only 4 tabs of her inderal 10mg  tabs are missing from bottle.  This is prescribed to her for situational anxiety.  She also has some alcohol on board, 3 empty sex [place] bottles found by paramedics.  Patient will not give any history, she simply looks at me crying and states "I am not okay".  Home Medications Prior to Admission medications   Medication Sig Start Date End Date Taking? Authorizing Provider  Multiple Vitamin (MULTIVITAMIN) tablet Take 1 tablet by mouth daily.    [provider]  propranolol (INDERAL) 10 MG tablet Take 1-2 tablets by mouth 60 minutes prior to presentation 05/26/23   Sandford Craze, NP  sertraline (ZOLOFT) 50 MG tablet 1/2 tab once daily at bedtime x 1 week, then increase to a full tab once daily on week two 05/26/23   Sandford Craze, NP  triamcinolone cream (KENALOG) 0.1 % SMARTSIG:1 Application Topical 2-3 Times Daily 02/19/23   [provider]      Allergies    Tretinoin    Review of Systems   Review of Systems  Psychiatric/Behavioral:  Positive for suicidal ideas.   All other systems reviewed and are negative.   Physical Exam Updated Vital Signs BP 111/87 (BP Location: Left Arm)   Pulse (!) 102   Temp 98.1 F (36.7 C) (Oral)   Resp 16   SpO2 94%   Physical Exam Vitals and nursing note reviewed.  Constitutional:      Appearance: She is well-developed.  HENT:     Head: Normocephalic and atraumatic.  Eyes:     Conjunctiva/sclera: Conjunctivae normal.     Pupils:  Pupils are equal, round, and reactive to light.  Cardiovascular:     Rate and Rhythm: Normal rate and regular rhythm.     Heart sounds: Normal heart sounds.  Pulmonary:     Effort: Pulmonary effort is normal.     Breath sounds: Normal breath sounds.  Abdominal:     General: Bowel sounds are normal.     Palpations: Abdomen is soft.  Musculoskeletal:        General: Normal range of motion.     Cervical back: Normal range of motion.  Skin:    General: Skin is warm and dry.  Neurological:     Mental Status: She is alert and oriented to person, place, and time.  Psychiatric:     Comments: Tearful on exam, states "I am not ok" but will not respond otherwise     ED Results / Procedures / Treatments   Labs (all labs ordered are listed, but only abnormal results are displayed) Labs Reviewed  CBC WITH DIFFERENTIAL/PLATELET - Abnormal; Notable for the following components:      Result Value   RBC 5.45 (*)    Hemoglobin 17.2 (*)    HCT 50.7 (*)    Platelets 491 (*)    Basophils Absolute 0.2 (*)    Abs Immature Granulocytes 0.08 (*)    All other components within normal limits  COMPREHENSIVE METABOLIC PANEL - Abnormal; Notable for the following components:   Potassium 3.4 (*)    CO2 21 (*)    Glucose, Bld 110 (*)    Total Protein 8.3 (*)    AST 42 (*)    Anion gap 16 (*)    All other components within normal limits  ETHANOL - Abnormal; Notable for the following components:   Alcohol, Ethyl (B) 331 (*)    All other components within normal limits  SALICYLATE LEVEL - Abnormal; Notable for the following components:   Salicylate Lvl <7.0 (*)    All other components within normal limits  ACETAMINOPHEN LEVEL - Abnormal; Notable for the following components:   Acetaminophen (Tylenol), Serum <10 (*)    All other components within normal limits  HCG, SERUM, QUALITATIVE  RAPID URINE DRUG SCREEN, HOSP PERFORMED    EKG None  Radiology No results found.  Procedures Procedures     Medications Ordered in ED Medications - No data to display  ED Course/ Medical Decision Making/ A&P                                 Medical Decision Making Amount and/or Complexity of Data Reviewed Labs: ordered. ECG/medicine tests: ordered.   27 year old female presenting to the ED with suicidal ideation.  She endorsed to friends that she wanted to kill herself but did not disclose any plan.  She does have EtOH on board and also apparently took some of her propranolol, per EMS only 4 tabs are missing.  She would not give me any additional history just states "I'm not ok".  Will check labs, plan for TTS consult.  11:36 PM Notified that patient had eloped from facility.  When I had just spoken with her she was willing to allow blood draw.  Patient not located in any restroom, waiting room, or immediately outside of ED.  She did not have any formal evaluation completed here.  IVC filed.  Patient was located at education building next door by [facility] and another staff member.  She gave false identification when confronted.  Brought back to ED with [employer] and now has sitter at bedside.  She was informed she is now under IVC.  Labs as above--no leukocytosis.  Very minor electrolyte derangements.  AST 42.  Ethanol 331.  Negative APAP and salicylate levels.  UDS pending.  Will get TTS evaluation.  Final Clinical Impression(s) / ED Diagnoses Final diagnoses:  Suicidal ideation    Rx / DC Orders ED Discharge Orders     None         Garlon Hatchet, PA-C 07/02/23 [id number]    Charlynne Pander, MD 07/03/23 [phone]

## 2023-07-01 NOTE — ED Notes (Signed)
Pt very flaccid upon arrival and refusing to answer any questions as far as to what brought her here at this time.

## 2023-07-02 ENCOUNTER — Encounter (HOSPITAL_COMMUNITY): Payer: Self-pay | Admitting: Psychiatry

## 2023-07-02 ENCOUNTER — Inpatient Hospital Stay (HOSPITAL_COMMUNITY)
Admission: AD | Admit: 2023-07-02 | Discharge: 2023-07-05 | DRG: 882 | Disposition: A | Discharge status: Home / Self Care | Payer: BC Managed Care – PPO | Source: Intra-hospital | Attending: Psychiatry | Admitting: Psychiatry

## 2023-07-02 DIAGNOSIS — D751 Secondary polycythemia: Secondary | ICD-10-CM | POA: Diagnosis present

## 2023-07-02 DIAGNOSIS — F4323 Adjustment disorder with mixed anxiety and depressed mood: Secondary | ICD-10-CM

## 2023-07-02 DIAGNOSIS — R7989 Other specified abnormal findings of blood chemistry: Secondary | ICD-10-CM | POA: Diagnosis present

## 2023-07-02 DIAGNOSIS — Z8249 Family history of ischemic heart disease and other diseases of the circulatory system: Secondary | ICD-10-CM

## 2023-07-02 DIAGNOSIS — F4321 Adjustment disorder with depressed mood: Secondary | ICD-10-CM

## 2023-07-02 DIAGNOSIS — R45851 Suicidal ideations: Secondary | ICD-10-CM | POA: Diagnosis not present

## 2023-07-02 DIAGNOSIS — F10129 Alcohol abuse with intoxication, unspecified: Secondary | ICD-10-CM | POA: Diagnosis not present

## 2023-07-02 DIAGNOSIS — F332 Major depressive disorder, recurrent severe without psychotic features: Principal | ICD-10-CM | POA: Diagnosis present

## 2023-07-02 DIAGNOSIS — Z888 Allergy status to other drugs, medicaments and biological substances status: Secondary | ICD-10-CM | POA: Diagnosis not present

## 2023-07-02 DIAGNOSIS — Z79899 Other long term (current) drug therapy: Secondary | ICD-10-CM | POA: Diagnosis not present

## 2023-07-02 DIAGNOSIS — Z803 Family history of malignant neoplasm of breast: Secondary | ICD-10-CM | POA: Diagnosis not present

## 2023-07-02 DIAGNOSIS — F10229 Alcohol dependence with intoxication, unspecified: Secondary | ICD-10-CM | POA: Diagnosis present

## 2023-07-02 DIAGNOSIS — Z634 Disappearance and death of family member: Secondary | ICD-10-CM

## 2023-07-02 DIAGNOSIS — Y908 Blood alcohol level of 240 mg/100 ml or more: Secondary | ICD-10-CM | POA: Diagnosis present

## 2023-07-02 LAB — CBC WITH DIFFERENTIAL/PLATELET
Abs Immature Granulocytes: 0.08 10*3/uL — ABNORMAL HIGH (ref 0.00–0.07)
Basophils Absolute: 0.2 10*3/uL — ABNORMAL HIGH (ref 0.0–0.1)
Basophils Relative: 2 %
Eosinophils Absolute: 0.3 10*3/uL (ref 0.0–0.5)
Eosinophils Relative: 3 %
HCT: 50.7 % — ABNORMAL HIGH (ref 36.0–46.0)
Hemoglobin: 17.2 g/dL — ABNORMAL HIGH (ref 12.0–15.0)
Immature Granulocytes: 1 %
Lymphocytes Relative: 38 %
Lymphs Abs: 3.8 10*3/uL (ref 0.7–4.0)
MCH: 31.6 pg (ref 26.0–34.0)
MCHC: 33.9 g/dL (ref 30.0–36.0)
MCV: 93 fL (ref 80.0–100.0)
Monocytes Absolute: 0.5 10*3/uL (ref 0.1–1.0)
Monocytes Relative: 5 %
Neutro Abs: 5.1 10*3/uL (ref 1.7–7.7)
Neutrophils Relative %: 51 %
Platelets: 491 10*3/uL — ABNORMAL HIGH (ref 150–400)
RBC: 5.45 MIL/uL — ABNORMAL HIGH (ref 3.87–5.11)
RDW: 12.9 % (ref 11.5–15.5)
WBC: 10 10*3/uL (ref 4.0–10.5)
nRBC: 0 % (ref 0.0–0.2)

## 2023-07-02 LAB — COMPREHENSIVE METABOLIC PANEL
ALT: 35 U/L (ref 0–44)
AST: 42 U/L — ABNORMAL HIGH (ref 15–41)
Albumin: 4.5 g/dL (ref 3.5–5.0)
Alkaline Phosphatase: 86 U/L (ref 38–126)
Anion gap: 16 — ABNORMAL HIGH (ref 5–15)
BUN: 10 mg/dL (ref 6–20)
CO2: 21 mmol/L — ABNORMAL LOW (ref 22–32)
Calcium: 8.9 mg/dL (ref 8.9–10.3)
Chloride: 103 mmol/L (ref 98–111)
Creatinine, Ser: 0.71 mg/dL (ref 0.44–1.00)
GFR, Estimated: >60 mL/min (ref >60)
Glucose, Bld: 110 mg/dL — ABNORMAL HIGH (ref 70–99)
Potassium: 3.4 mmol/L — ABNORMAL LOW (ref 3.5–5.1)
Sodium: 140 mmol/L (ref 135–145)
Total Bilirubin: 0.9 mg/dL (ref 0.3–1.2)
Total Protein: 8.3 g/dL — ABNORMAL HIGH (ref 6.5–8.1)

## 2023-07-02 LAB — SALICYLATE LEVEL: Salicylate Lvl: <7 mg/dL — ABNORMAL LOW (ref 7.0–30.0)

## 2023-07-02 LAB — ETHANOL: Alcohol, Ethyl (B): 331 mg/dL (ref <10)

## 2023-07-02 LAB — HCG, SERUM, QUALITATIVE: Preg, Serum: NEGATIVE

## 2023-07-02 LAB — ACETAMINOPHEN LEVEL: Acetaminophen (Tylenol), Serum: <10 ug/mL — ABNORMAL LOW (ref 10–30)

## 2023-07-02 MED ORDER — LORAZEPAM 1 MG PO TABS: 2.0000 mg | ORAL_TABLET | Freq: Three times a day (TID) | ORAL | Status: DC | PRN

## 2023-07-02 MED ORDER — MAGNESIUM HYDROXIDE 400 MG/5ML PO SUSP: 30.0000 mL | Freq: Every day | ORAL | Status: DC | PRN

## 2023-07-02 MED ORDER — HYDROXYZINE HCL 25 MG PO TABS
25.0000 mg | ORAL_TABLET | Freq: Three times a day (TID) | ORAL | Status: DC | PRN
Start: 2023-07-02 — End: 2023-07-05
  Filled 2023-07-02: qty 1

## 2023-07-02 MED ORDER — SERTRALINE HCL 25 MG PO TABS
25.0000 mg | ORAL_TABLET | Freq: Every day | ORAL | Status: DC
  Filled 2023-07-02 (×3): qty 1

## 2023-07-02 MED ORDER — DIPHENHYDRAMINE HCL 50 MG/ML IJ SOLN: 50.0000 mg | Freq: Three times a day (TID) | INTRAMUSCULAR | Status: DC | PRN

## 2023-07-02 MED ORDER — ALUM & MAG HYDROXIDE-SIMETH 200-200-20 MG/5ML PO SUSP: 30.0000 mL | ORAL | Status: DC | PRN

## 2023-07-02 MED ORDER — LORAZEPAM 2 MG/ML IJ SOLN: 2.0000 mg | Freq: Three times a day (TID) | INTRAMUSCULAR | Status: DC | PRN

## 2023-07-02 MED ORDER — HYDROXYZINE HCL 25 MG PO TABS: 25.0000 mg | ORAL_TABLET | Freq: Three times a day (TID) | ORAL | Status: DC | PRN

## 2023-07-02 MED ORDER — PROPRANOLOL HCL 20 MG PO TABS
20.0000 mg | ORAL_TABLET | Freq: Every day | ORAL | Status: DC
  Filled 2023-07-02 (×2): qty 1

## 2023-07-02 MED ORDER — ACETAMINOPHEN 325 MG PO TABS: 650.0000 mg | ORAL_TABLET | Freq: Four times a day (QID) | ORAL | Status: DC | PRN

## 2023-07-02 MED ORDER — HALOPERIDOL LACTATE 5 MG/ML IJ SOLN: 5.0000 mg | Freq: Three times a day (TID) | INTRAMUSCULAR | Status: DC | PRN

## 2023-07-02 MED ORDER — HALOPERIDOL 5 MG PO TABS
5.0000 mg | ORAL_TABLET | Freq: Three times a day (TID) | ORAL | Status: DC | PRN
Start: 2023-07-02 — End: 2023-07-05

## 2023-07-02 MED ORDER — DIPHENHYDRAMINE HCL 25 MG PO CAPS
50.0000 mg | ORAL_CAPSULE | Freq: Three times a day (TID) | ORAL | Status: DC | PRN
Start: 2023-07-02 — End: 2023-07-05

## 2023-07-02 MED ORDER — TRAZODONE HCL 50 MG PO TABS
50.0000 mg | ORAL_TABLET | Freq: Every evening | ORAL | Status: DC | PRN
  Filled 2023-07-02: qty 1

## 2023-07-02 NOTE — Progress Notes (Addendum)
Patient is a 27 year female admitted to unit under IVC due to suicidal ideation with no plan. Upon admission assessment, pt noted to be pleasant and cooperative. Pt denies current suicidal ideation and is able to verbally contract for safety. Patient stated, " My Mom died a week ago and I just couldn't deal with it".  Pt denied HI and AVH. Skin and safety check completed, skin intact, no contraband found. Pt escorted on unit by RN and orientation provided with verbalization of understand. Plan of Care initiated. Safety maintained.  07/02/23 1600  Psych Admission Type (Psych Patients Only)  Admission Status Involuntary  Psychosocial Assessment  Patient Complaints Anxiety;Depression;Sadness  Eye Contact Brief  Facial Expression Anxious;Sad  Affect Depressed  Speech Logical/coherent  Interaction Assertive  Motor Activity Other (Comment) (WNL)  Appearance/Hygiene In scrubs  Behavior Characteristics Anxious;Cooperative  Mood Depressed;Sad  Aggressive Behavior  Effect No apparent injury  Thought Process  Coherency WDL  Content WDL  Delusions None reported or observed  Perception WDL  Hallucination None reported or observed  Judgment WDL  Confusion None  Danger to Self  Current suicidal ideation? Denies  Danger to Others  Danger to Others None reported or observed  Danger to Others Abnormal  Harmful Behavior to others No threats or harm toward other people  Destructive Behavior No threats or harm toward property

## 2023-07-02 NOTE — ED Notes (Signed)
Family(Boyfriend & Brother) allowed at bedside.

## 2023-07-02 NOTE — Consult Note (Addendum)
[facility] Consult Note  Patient Name: Charlene Reed MRN: [id number] DOB: December 19, 1995 DATE OF Consult: 07/02/2023  PRIMARY PSYCHIATRIC DIAGNOSES  1.  Adjustment disorder with depressed mood and anxiety 2.  Grief 3.  Anxiety 4. Alcohol intoxication  RECOMMENDATIONS  Admit to inpatient mental treatment for safety and stabilization.  Non-Medication/therapeutic recommendations: Refer to outpatient psychiatric provider for medication management and therapy upon discharge from inpatient psych admission. Patient would benefit from grief counseling.  Communication: Treatment team members (and family members if applicable) who were involved in treatment/care discussions and planning, and with whom we spoke or engaged with via secure text/chat, include the following:  treatment team  Thank you for involving [place] in the care of this patient. If you have any additional questions or concerns, please call [phone] and ask for me or the provider on-call.  TELEPSYCHIATRY ATTESTATION & CONSENT  As the provider for this telehealth consult, I attest that I verified the patient's identity using two separate identifiers, introduced myself to the patient, provided my credentials, disclosed my location, and performed this encounter via a HIPAA-compliant, real-time, face-to-face, two-way, interactive audio and video platform and with the full consent and agreement of the patient (or guardian as applicable.)  Patient physical location: [facility]. Telehealth provider physical location: home office in state of [place].  Video start time: 0546 (Central Time) Video end time: 0606 (Central Time)  IDENTIFYING DATA  Charlene Reed is a 27 y.o. year-old female for whom a psychiatric consultation has been ordered by the primary provider. The patient was identified using two separate identifiers.  CHIEF COMPLAINT/REASON FOR CONSULT  "I'm depressed, and I thought about killing myself."   HISTORY OF PRESENT  ILLNESS (HPI)  Patient is a 27 year old female who presented to the emergency department with severe depression and suicidal ideation following the recent death of her mother two days ago. She expressed profound grief and sadness, stating that she had thoughts of killing herself. This was her first experience with suicidal ideation, and she had not previously attempted suicide. The patient reported feeling overwhelmed by the loss of her mother, who had been ill prior to her passing. She currently lives with her 2 year old brother, although they are not close, and she is unsure of how he is coping with their mother's death.  The patient disclosed that she ingested propranolol 4 10mg  tablets, a medication she is prescribed for anxiety, in an attempt to harm herself. She also mentioned consuming alcohol to cope with her grief, although she reports that she typically does not drink. Her alcohol consumption history includes drinking six shots of liquor per week during college. BAL on arrival to the ER was 331.  The patient has a history of anxiety but has not been formally diagnosed with depression, although she feels she may have it. She has not been seeing a therapist recently but has seen one in the past. She denied any history of psychiatric hospital admissions. There were no reports of hallucinations, delusions, or other psychotic symptoms. She also denied any family history of mental health illnesses.  During the evaluation, the patient expressed a strong desire to be with her boyfriend, whom she considers her primary support system. She was distressed about being admitted to the hospital and was concerned about losing her job and home. She attempted to leave the emergency department earlier but was brought back and involuntarily committed for her safety.  The patient was informed about the involuntary commitment process and the importance of receiving  treatment and support during this difficult  time.  Patient gave consent to call her brother and significant other. Brother reports that patient was saying she wanted to die and was going to kill herself.   PAST PSYCHIATRIC HISTORY  - No prior psychiatric hospitalizations - No recent therapy, but has seen a therapist in the past - Diagnosed with anxiety  PAST MEDICAL HISTORY  No past medical history on file.   HOME MEDICATIONS  PTA Medications  Medication Sig   Multiple Vitamin (MULTIVITAMIN) tablet Take 1 tablet by mouth daily.   triamcinolone cream (KENALOG) 0.1 % SMARTSIG:1 Application Topical 2-3 Times Daily   sertraline (ZOLOFT) 50 MG tablet 1/2 tab once daily at bedtime x 1 week, then increase to a full tab once daily on week two   propranolol (INDERAL) 10 MG tablet Take 1-2 tablets by mouth 60 minutes prior to presentation     ALLERGIES  Allergies  Allergen Reactions   Tretinoin Dermatitis and Rash    SOCIAL & SUBSTANCE USE HISTORY  Social History   Socioeconomic History   Marital status: Single    Spouse name: Not on file   Number of children: Not on file   Years of education: Not on file   Highest education level: Bachelor's degree (e.g., BA, AB, BS)  Occupational History   Not on file  Tobacco Use   Smoking status: Never   Smokeless tobacco: Never  Vaping Use   Vaping status: Never Used  Substance and Sexual Activity   Alcohol use: Yes    Alcohol/week: 6.0 standard drinks of alcohol    Types: 6 Shots of liquor per week   Drug use: Never   Sexual activity: Yes    Partners: Male    Birth control/protection: Condom  Other Topics Concern   Not on file  Social History Narrative   Not on file   Social Determinants of Health   Financial Resource Strain: Low Risk  (12/10/2022)   Overall Financial Resource Strain (CARDIA)    Difficulty of Paying Living Expenses: Not hard at all  Food Insecurity: No Food Insecurity (12/10/2022)   Hunger Vital Sign    Worried About Running Out of Food in the Last Year:  Never true    Ran Out of Food in the Last Year: Never true  Transportation Needs: No Transportation Needs (12/10/2022)   PRAPARE - Administrator, Civil Service (Medical): No    Lack of Transportation (Non-Medical): No  Physical Activity: Sufficiently Active (12/10/2022)   Exercise Vital Sign    Days of Exercise per Week: 5 days    Minutes of Exercise per Session: 60 min  Stress: No Stress Concern Present (12/10/2022)   [employer] of Occupational Health - Occupational Stress Questionnaire    Feeling of Stress : Only a little  Social Connections: Moderately Integrated (12/10/2022)   Social Connection and Isolation Panel [NHANES]    Frequency of Communication with Friends and Family: More than three times a week    Frequency of Social Gatherings with Friends and Family: More than three times a week    Attends Religious Services: 1 to 4 times per year    Active Member of [employer] or Organizations: Yes    Attends Banker Meetings: 1 to 4 times per year    Marital Status: Never married   Social History   Tobacco Use  Smoking Status Never  Smokeless Tobacco Never   Social History   Substance and Sexual Activity  Alcohol Use Yes   Alcohol/week: 6.0 standard drinks of alcohol   Types: 6 Shots of liquor per week   Social History   Substance and Sexual Activity  Drug Use Never    Additional pertinent information .  FAMILY HISTORY  Family History  Problem Relation Age of Onset   Heart disease Father    Heart attack Father    High blood pressure Father    Breast cancer Maternal Grandmother    Family Psychiatric History (if known):  denies  MENTAL STATUS EXAM (MSE)  Presentation  General Appearance: Appropriate for Environment Eye Contact:Fair Speech:Clear and Coherent Speech Volume:Normal Handedness:Right  Mood and Affect  Mood:Depressed; Anxious Affect:Congruent  Thought Process  Thought Processes:Coherent Descriptions of  Associations:Intact  Orientation:Full (Time, Place and Person)  Thought Content:Logical  History of Schizophrenia/Schizoaffective disorder:No data recorded Duration of Psychotic Symptoms:No data recorded Hallucinations:Hallucinations: None  Ideas of Reference:None  Suicidal Thoughts:Suicidal Thoughts: Yes, Passive  Homicidal Thoughts:Homicidal Thoughts: No   Sensorium  Memory:Immediate Good; Recent Good; Remote Good Judgment:Fair Insight:Fair  Executive Functions  Concentration:Good Attention Span:Good Recall:Good Fund of Knowledge:Good Language:Good  Psychomotor Activity  Psychomotor Activity:Psychomotor Activity: Normal  Assets  Assets:Communication Skills; Housing (family support)  Sleep  Sleep:Sleep: Fair   VITALS  Blood pressure 111/87, pulse (!) 102, temperature 98.1 F (36.7 C), temperature source Oral, resp. rate 16, SpO2 94%.  LABS  Admission on 07/01/2023  Component Date Value Ref Range Status   WBC 07/01/2023 10.0  4.0 - 10.5 K/uL Final   RBC 07/01/2023 5.45 (H)  3.87 - 5.11 MIL/uL Final   Hemoglobin 07/01/2023 17.2 (H)  12.0 - 15.0 g/dL Final   HCT 72/53/6644 50.7 (H)  36.0 - 46.0 % Final   MCV 07/01/2023 93.0  80.0 - 100.0 fL Final   MCH 07/01/2023 31.6  26.0 - 34.0 pg Final   MCHC 07/01/2023 33.9  30.0 - 36.0 g/dL Final   RDW 03/47/4259 12.9  11.5 - 15.5 % Final   Platelets 07/01/2023 491 (H)  150 - 400 K/uL Final   nRBC 07/01/2023 0.0  0.0 - 0.2 % Final   Neutrophils Relative % 07/01/2023 51  % Final   Neutro Abs 07/01/2023 5.1  1.7 - 7.7 K/uL Final   Lymphocytes Relative 07/01/2023 38  % Final   Lymphs Abs 07/01/2023 3.8  0.7 - 4.0 K/uL Final   Monocytes Relative 07/01/2023 5  % Final   Monocytes Absolute 07/01/2023 0.5  0.1 - 1.0 K/uL Final   Eosinophils Relative 07/01/2023 3  % Final   Eosinophils Absolute 07/01/2023 0.3  0.0 - 0.5 K/uL Final   Basophils Relative 07/01/2023 2  % Final   Basophils Absolute 07/01/2023 0.2 (H)  0.0 - 0.1  K/uL Final   Immature Granulocytes 07/01/2023 1  % Final   Abs Immature Granulocytes 07/01/2023 0.08 (H)  0.00 - 0.07 K/uL Final   Performed at [facility], [address] [place], [place] [address]   Sodium 07/01/2023 140  135 - 145 mmol/L Final   Potassium 07/01/2023 3.4 (L)  3.5 - 5.1 mmol/L Final   Chloride 07/01/2023 103  98 - 111 mmol/L Final   CO2 07/01/2023 21 (L)  22 - 32 mmol/L Final   Glucose, Bld 07/01/2023 110 (H)  70 - 99 mg/dL Final   Glucose reference range applies only to samples taken after fasting for at least 8 hours.   BUN 07/01/2023 10  6 - 20 mg/dL Final   Creatinine, Ser 07/01/2023 0.71  0.44 - 1.00 mg/dL Final   Calcium 40/98/1191 8.9  8.9 - 10.3 mg/dL Final   Total Protein 47/82/9562 8.3 (H)  6.5 - 8.1 g/dL Final   Albumin 13/04/6577 4.5  3.5 - 5.0 g/dL Final   AST 46/96/2952 42 (H)  15 - 41 U/L Final   ALT 07/01/2023 35  0 - 44 U/L Final   Alkaline Phosphatase 07/01/2023 86  38 - 126 U/L Final   Total Bilirubin 07/01/2023 0.9  0.3 - 1.2 mg/dL Final   GFR, Estimated 07/01/2023 >60  >60 mL/min Final   Comment: (NOTE) Calculated using the CKD-EPI Creatinine Equation (2021)    Anion gap 07/01/2023 16 (H)  5 - 15 Final   Performed at [facility], [address] [place], [place] [address]   Alcohol, Ethyl (B) 07/01/2023 331 (HH)  <10 mg/dL Final   Comment: CRITICAL RESULT CALLED TO, READ BACK BY AND VERIFIED WITH johnson, j. rn at 0057 on 07/02/23. fa (NOTE) Lowest detectable limit for serum alcohol is 10 mg/dL.  For medical purposes only. Performed at [facility], [address] [place], [place] [address]    Salicylate Lvl 07/01/2023 <7.0 (L)  7.0 - 30.0 mg/dL Final   Performed at [facility], [address] [place], [place] [address]   Acetaminophen (Tylenol), Serum 07/01/2023 <10 (L)  10 - 30 ug/mL Final   Comment: (NOTE) Therapeutic concentrations vary significantly. A  range of 10-30 ug/mL  may be an effective concentration for many patients. However, some  are best treated at concentrations outside of this range. Acetaminophen concentrations >150 ug/mL at 4 hours after ingestion  and >50 ug/mL at 12 hours after ingestion are often associated with  toxic reactions.  Performed at [facility], [address], [place], [place] [address]    Preg, Serum 07/01/2023 NEGATIVE  NEGATIVE Final   Comment:        THE SENSITIVITY OF THIS METHODOLOGY IS >10 mIU/mL. Performed at [facility], [address], [place], [place] [address]     PSYCHIATRIC REVIEW OF SYSTEMS (ROS)  In summary, the patient is experiencing severe depression and suicidal ideation following the recent death of her mother. She has a history of anxiety and has been using propranolol as needed. She is currently involuntarily committed for her safety and will receive treatment and support during her hospital stay.  Additional findings:      Musculoskeletal: No abnormal movements observed      Gait & Station: Laying/Sitting      Pain Screening: Denies      Nutrition & Dental Concerns: none reported  RISK FORMULATION/ASSESSMENT  Is the patient experiencing any suicidal or homicidal ideations: yes       Explain if yes: drank alcohol and thought of cutting her wrists Protective factors considered for safety management: supportive family, access to appropriate psych care  Risk factors/concerns considered for safety management:  Depression Recent loss  Is there a safety management plan with the patient and treatment team to minimize risk factors and promote protective factors: Yes           Explain: admit to psych Is crisis care placement or psychiatric hospitalization recommended: Yes     Based on my current evaluation and risk assessment, patient is determined at this time to be at:  High risk  *RISK ASSESSMENT Risk assessment is a dynamic process; it  is possible that this patient's condition, and risk level, may  change. This should be re-evaluated and managed over time as appropriate. Please re-consult psychiatric consult services if additional assistance is needed in terms of risk assessment and management. If your team decides to discharge this patient, please advise the patient how to best access emergency psychiatric services, or to call 911, if their condition worsens or they feel unsafe in any way.   Norval Morton, NP Tele[facility] Consult Services

## 2023-07-02 NOTE — Progress Notes (Signed)
[facility] Psych ED Progress Note  07/02/2023 2:13 PM Charlene Reed  MRN:  [id number]   Subjective:   Principal Problem: Adjustment disorder with mixed anxiety and depressed mood Diagnosis:  Principal Problem:   Adjustment disorder with mixed anxiety and depressed mood Active Problems:   Suicidal ideation   Grief    ED Assessment Time Calculation: Start Time: 1000 Stop Time: 1015 Total Time in Minutes (Assessment Completion): 15   Past Psychiatric History: On evaluation today, the patient is sitting on the side of her bed. She is calm and cooperative, but tearful during this assessment. Her appearance is appropriate for environment. Her eye contact is good.  Speech is clear and coherent, normal pace and normal volume. She is alert and oriented x4 to person, place, time, and situation. Patient mood is depressed.  Affect is congruent with mood.  Thought process is coherent.  Thought content is within normal limit. She denies auditory and visual hallucinations. No indication that she is responding to internal stimuli during this assessment.  No delusions elicited during this assessment.  She denies suicidal ideations.  She denies homicidal ideations. Appetite and sleep are fair. Patient continues to endorse depression, and uncertainty about her future and going back home, to where she and mother lived. She states she has a brother that she is close with but he lives in a different state. She is ok with being admitted to [facility].     [place] Scale:  Flowsheet Row ED from 07/01/2023 in [facility] Emergency Department at [facility] ED from 11/29/2022 in [facility] Emergency Department at [facility] ED from 09/25/2022 in [facility] at [facility])  C-SSRS RISK CATEGORY High Risk No Risk No Risk       Past Medical History: No past medical history on file. No past surgical history on file. Family History:  Family History  Problem Relation Age of Onset    Heart disease Father    Heart attack Father    High blood pressure Father    Breast cancer Maternal Grandmother     Social History:  Social History   Substance and Sexual Activity  Alcohol Use Yes   Alcohol/week: 6.0 standard drinks of alcohol   Types: 6 Shots of liquor per week     Social History   Substance and Sexual Activity  Drug Use Never    Social History   Socioeconomic History   Marital status: Single    Spouse name: Not on file   Number of children: Not on file   Years of education: Not on file   Highest education level: Bachelor's degree (e.g., BA, AB, BS)  Occupational History   Not on file  Tobacco Use   Smoking status: Never   Smokeless tobacco: Never  Vaping Use   Vaping status: Never Used  Substance and Sexual Activity   Alcohol use: Yes    Alcohol/week: 6.0 standard drinks of alcohol    Types: 6 Shots of liquor per week   Drug use: Never   Sexual activity: Yes    Partners: Male    Birth control/protection: Condom  Other Topics Concern   Not on file  Social History Narrative   Not on file   Social Determinants of Health   Financial Resource Strain: Low Risk  (12/10/2022)   Overall Financial Resource Strain (CARDIA)    Difficulty of Paying Living Expenses: Not hard at all  Food Insecurity: No Food Insecurity (12/10/2022)   Hunger  Vital Sign    Worried About Programme researcher, broadcasting/film/video in the Last Year: Never true    Ran Out of Food in the Last Year: Never true  Transportation Needs: No Transportation Needs (12/10/2022)   PRAPARE - Administrator, Civil Service (Medical): No    Lack of Transportation (Non-Medical): No  Physical Activity: Sufficiently Active (12/10/2022)   Exercise Vital Sign    Days of Exercise per Week: 5 days    Minutes of Exercise per Session: 60 min  Stress: No Stress Concern Present (12/10/2022)   [employer] of Occupational Health - Occupational Stress Questionnaire    Feeling of Stress : Only a little   Social Connections: Moderately Integrated (12/10/2022)   Social Connection and Isolation Panel [NHANES]    Frequency of Communication with Friends and Family: More than three times a week    Frequency of Social Gatherings with Friends and Family: More than three times a week    Attends Religious Services: 1 to 4 times per year    Active Member of [employer] or Organizations: Yes    Attends Banker Meetings: 1 to 4 times per year    Marital Status: Never married    Sleep: Fair  Appetite:  Fair  Current Medications: Current Facility-Administered Medications  Medication Dose Route Frequency Provider Last Rate Last Admin   hydrOXYzine (ATARAX) tablet 25 mg  25 mg Oral TID PRN Norval Morton, NP       Current Outpatient Medications  Medication Sig Dispense Refill   Multiple Vitamin (MULTIVITAMIN) tablet Take 1 tablet by mouth daily.     propranolol (INDERAL) 10 MG tablet Take 1-2 tablets by mouth 60 minutes prior to presentation 20 tablet 1   sertraline (ZOLOFT) 50 MG tablet 1/2 tab once daily at bedtime x 1 week, then increase to a full tab once daily on week two 30 tablet 0   triamcinolone cream (KENALOG) 0.1 % SMARTSIG:1 Application Topical 2-3 Times Daily      Lab Results:  Results for orders placed or performed during the hospital encounter of 07/01/23 (from the past 48 hour(s))  CBC with Differential     Status: Abnormal   Collection Time: 07/01/23 10:49 PM  Result Value Ref Range   WBC 10.0 4.0 - 10.5 K/uL   RBC 5.45 (H) 3.87 - 5.11 MIL/uL   Hemoglobin 17.2 (H) 12.0 - 15.0 g/dL   HCT 16.1 (H) 09.6 - 04.5 %   MCV 93.0 80.0 - 100.0 fL   MCH 31.6 26.0 - 34.0 pg   MCHC 33.9 30.0 - 36.0 g/dL   RDW 40.9 81.1 - 91.4 %   Platelets 491 (H) 150 - 400 K/uL   nRBC 0.0 0.0 - 0.2 %   Neutrophils Relative % 51 %   Neutro Abs 5.1 1.7 - 7.7 K/uL   Lymphocytes Relative 38 %   Lymphs Abs 3.8 0.7 - 4.0 K/uL   Monocytes Relative 5 %   Monocytes Absolute 0.5 0.1 - 1.0 K/uL    Eosinophils Relative 3 %   Eosinophils Absolute 0.3 0.0 - 0.5 K/uL   Basophils Relative 2 %   Basophils Absolute 0.2 (H) 0.0 - 0.1 K/uL   Immature Granulocytes 1 %   Abs Immature Granulocytes 0.08 (H) 0.00 - 0.07 K/uL    Comment: Performed at [facility], [address] [place], [place] [address]  Comprehensive metabolic panel     Status: Abnormal   Collection Time: 07/01/23 10:49 PM  Result Value Ref Range   Sodium 140 135 - 145 mmol/L   Potassium 3.4 (L) 3.5 - 5.1 mmol/L   Chloride 103 98 - 111 mmol/L   CO2 21 (L) 22 - 32 mmol/L   Glucose, Bld 110 (H) 70 - 99 mg/dL    Comment: Glucose reference range applies only to samples taken after fasting for at least 8 hours.   BUN 10 6 - 20 mg/dL   Creatinine, Ser 9.81 0.44 - 1.00 mg/dL   Calcium 8.9 8.9 - 19.1 mg/dL   Total Protein 8.3 (H) 6.5 - 8.1 g/dL   Albumin 4.5 3.5 - 5.0 g/dL   AST 42 (H) 15 - 41 U/L   ALT 35 0 - 44 U/L   Alkaline Phosphatase 86 38 - 126 U/L   Total Bilirubin 0.9 0.3 - 1.2 mg/dL   GFR, Estimated >47 >82 mL/min    Comment: (NOTE) Calculated using the CKD-EPI Creatinine Equation (2021)    Anion gap 16 (H) 5 - 15    Comment: Performed at [facility], [address] [place], [place] [address]  Ethanol     Status: Abnormal   Collection Time: 07/01/23 10:49 PM  Result Value Ref Range   Alcohol, Ethyl (B) 331 (HH) <10 mg/dL    Comment: CRITICAL RESULT CALLED TO, READ BACK BY AND VERIFIED WITH johnson, j. rn at 0057 on 07/02/23. fa (NOTE) Lowest detectable limit for serum alcohol is 10 mg/dL.  For medical purposes only. Performed at [facility], [address] [place], [place] [address]   Salicylate level     Status: Abnormal   Collection Time: 07/01/23 10:49 PM  Result Value Ref Range   Salicylate Lvl <7.0 (L) 7.0 - 30.0 mg/dL    Comment: Performed at [facility], [address], [place], [place] [address]  Acetaminophen level      Status: Abnormal   Collection Time: 07/01/23 10:49 PM  Result Value Ref Range   Acetaminophen (Tylenol), Serum <10 (L) 10 - 30 ug/mL    Comment: (NOTE) Therapeutic concentrations vary significantly. A range of 10-30 ug/mL  may be an effective concentration for many patients. However, some  are best treated at concentrations outside of this range. Acetaminophen concentrations >150 ug/mL at 4 hours after ingestion  and >50 ug/mL at 12 hours after ingestion are often associated with  toxic reactions.  Performed at [facility], [address], [place], [place] [address]   hCG, serum, qualitative     Status: None   Collection Time: 07/01/23 10:49 PM  Result Value Ref Range   Preg, Serum NEGATIVE NEGATIVE    Comment:        THE SENSITIVITY OF THIS METHODOLOGY IS >10 mIU/mL. Performed at [facility], [address], [place], [place] [address]     Blood Alcohol level:  Lab Results  Component Value Date   ETH 331 [facility]) 07/01/2023    Physical Findings:  CIWA:    COWS:     Musculoskeletal: Strength & Muscle Tone: within normal limits Gait & Station: normal Patient leans: N/A  Psychiatric Specialty Exam:  Presentation  General Appearance:  Appropriate for Environment  Eye Contact: Good  Speech: Clear and Coherent  Speech Volume: Normal  Handedness: Right   Mood and Affect  Mood: Depressed  Affect: Depressed; Flat   Thought Process  Thought Processes: Coherent  Descriptions of Associations:Intact  Orientation:Full (Time, Place and Person)  Thought Content:WDL  History of Schizophrenia/Schizoaffective disorder:No data recorded Duration of Psychotic Symptoms:No data recorded Hallucinations:Hallucinations: None  Ideas of Reference:None  Suicidal Thoughts:Suicidal Thoughts: Yes, Passive  Homicidal Thoughts:Homicidal Thoughts: No   Sensorium  Memory: Immediate Good; Recent Good; Remote  Good  Judgment: Fair  Insight: Good   Executive Functions  Concentration: Fair  Attention Span: Fair  Recall: Fair  Fund of Knowledge: Fair  Language: Fair   Psychomotor Activity  Psychomotor Activity: Psychomotor Activity: Normal   Assets  Assets: Communication Skills; Desire for Improvement; Social Support; Housing; Intimacy   Sleep  Sleep: Sleep: Poor    Physical Exam: Physical Exam Vitals and nursing note reviewed. Exam conducted with a chaperone present.  Neurological:     Mental Status: She is alert.  Psychiatric:        Attention and Perception: Attention normal.        Mood and Affect: Mood is anxious and depressed. Affect is flat.        Speech: Speech normal.        Behavior: Behavior is cooperative.        Thought Content: Thought content includes suicidal ideation.        Cognition and Memory: Memory normal.        Judgment: Judgment is inappropriate.    Review of Systems  Constitutional: Negative.   Psychiatric/Behavioral:  Positive for depression, substance abuse and suicidal ideas.    Blood pressure 106/65, pulse (!) 105, temperature 98 F (36.7 C), resp. rate 18, SpO2 97%. There is no height or weight on file to calculate BMI.    Medical Decision Making: Pt case reviewed and discussed with Dr. Lucianne Muss. Pt does meet criteria for IVC and inpatient psychiatric treatment. Patient needs inpatient psychiatric admission for stabilization and treatment. [facility]HH and CSW notified of disposition. Will restart patient home medications. EDP, RN, and LCSW notified of disposition.      Charlene Reed, PMHNP 07/02/2023, 2:13 PM

## 2023-07-02 NOTE — ED Notes (Signed)
TTS consult in progress. °

## 2023-07-02 NOTE — ED Notes (Addendum)
Pt located at the educational center by [facility] and another [facility]. Pt gave a false name. Pt brought back to the ED by [employer] and AC. Tech pulled from the floor to bedside until a Recruitment consultant is provided. Provider made aware that Pt is back in the ED. Pt dressed out in purple scrubs, all belongings obtained/labeled, and placed in cabinet. Pt refusing to allow staff to draw her blood work.

## 2023-07-02 NOTE — ED Notes (Signed)
Pt belonging bag is in 16-18 Res A  PT HAD A DARK BLUE SCRUBS AND A BLACK JACKET WITH A GREY SNEAKERS AND HER SOCKS.  HER APPLE WATCH IS IN THE PT BELONGING AS WELL .

## 2023-07-02 NOTE — ED Notes (Signed)
Pt refusing all blood work. Provider made aware.

## 2023-07-02 NOTE — Plan of Care (Signed)
  Problem: Education: Goal: Knowledge of [facility] Education information/materials will improve Outcome: Progressing Goal: Emotional status will improve Outcome: Progressing Goal: Mental status will improve Outcome: Progressing Goal: Verbalization of understanding the information provided will improve Outcome: Progressing   Problem: Safety: Goal: Periods of time without injury will increase Outcome: Progressing   

## 2023-07-02 NOTE — ED Notes (Signed)
Pt belongings moved to locker 34

## 2023-07-02 NOTE — Progress Notes (Signed)
Pt has been accepted to [facility] on 07/02/2023 Bed assignment: 405.2   Pt meets inpatient criteria per: Alona Bene, PMHNP   Attending Physician will be Dr. Sarita Bottom, MD   Report can be called to:Adult unit: [phone]  Pt can arrive after 2 pm   Care Team Notified: [facility]  [facility] RN, Alona Bene, PMHNP [facility] Paramedic, Jacquelynn Cree    [place] Shirely Toren LCSW-A   07/02/2023 2:52 PM

## 2023-07-03 ENCOUNTER — Encounter (HOSPITAL_COMMUNITY): Payer: Self-pay

## 2023-07-03 DIAGNOSIS — F4323 Adjustment disorder with mixed anxiety and depressed mood: Secondary | ICD-10-CM | POA: Diagnosis not present

## 2023-07-03 MED ORDER — TRAZODONE HCL 50 MG PO TABS
25.0000 mg | ORAL_TABLET | Freq: Every evening | ORAL | Status: DC | PRN
  Administered 2023-07-03 – 2023-07-04 (×2): 25 mg via ORAL
  Filled 2023-07-03 (×8): qty 0.5

## 2023-07-03 MED ORDER — SERTRALINE HCL 50 MG PO TABS
50.0000 mg | ORAL_TABLET | Freq: Every day | ORAL | Status: DC
  Administered 2023-07-03: 50 mg via ORAL
  Filled 2023-07-03 (×3): qty 1

## 2023-07-03 NOTE — BH IP Treatment Plan (Signed)
Interdisciplinary Treatment and Diagnostic Plan Update  07/03/2023 Time of Session: 10:40 AM  Charlene Reed MRN: [id number]  Principal Diagnosis: Adjustment disorder with mixed anxiety and depressed mood  Secondary Diagnoses: Principal Problem:   Adjustment disorder with mixed anxiety and depressed mood   Current Medications:  Current Facility-Administered Medications  Medication Dose Route Frequency Provider Last Rate Last Admin   acetaminophen (TYLENOL) tablet 650 mg  650 mg Oral Q6H PRN Motley-Mangrum, Jadeka A, PMHNP       alum & mag hydroxide-simeth (MAALOX/MYLANTA) 200-200-20 MG/5ML suspension 30 mL  30 mL Oral Q4H PRN Motley-Mangrum, Jadeka A, PMHNP       diphenhydrAMINE (BENADRYL) capsule 50 mg  50 mg Oral TID PRN Motley-Mangrum, Jadeka A, PMHNP       Or   diphenhydrAMINE (BENADRYL) injection 50 mg  50 mg Intramuscular TID PRN Motley-Mangrum, Jadeka A, PMHNP       haloperidol (HALDOL) tablet 5 mg  5 mg Oral TID PRN Motley-Mangrum, Jadeka A, PMHNP       Or   haloperidol lactate (HALDOL) injection 5 mg  5 mg Intramuscular TID PRN Motley-Mangrum, Jadeka A, PMHNP       hydrOXYzine (ATARAX) tablet 25 mg  25 mg Oral TID PRN Motley-Mangrum, Jadeka A, PMHNP       LORazepam (ATIVAN) tablet 2 mg  2 mg Oral TID PRN Motley-Mangrum, Jadeka A, PMHNP       Or   LORazepam (ATIVAN) injection 2 mg  2 mg Intramuscular TID PRN Motley-Mangrum, Jadeka A, PMHNP       magnesium hydroxide (MILK OF MAGNESIA) suspension 30 mL  30 mL Oral Daily PRN Motley-Mangrum, Jadeka A, PMHNP       sertraline (ZOLOFT) tablet 50 mg  50 mg Oral QHS Carrion-Carrero, Margely, MD       traZODone (DESYREL) tablet 25 mg  25 mg Oral QHS,MR X 1 Carrion-Carrero, Margely, MD       PTA Medications: Medications Prior to Admission  Medication Sig Dispense Refill Last Dose   propranolol (INDERAL) 10 MG tablet Take 1-2 tablets by mouth 60 minutes prior to presentation 20 tablet 1 Past Month   sertraline (ZOLOFT) 25 MG tablet Take  25 mg by mouth at bedtime.   Past Month   traZODone (DESYREL) 50 MG tablet Take 50 mg by mouth at bedtime as needed.   Past Month   tretinoin (RETIN-A) 0.025 % cream Apply 1 Application topically at bedtime.   Past Month    Patient Stressors:    Patient Strengths:    Treatment Modalities: Medication Management, Group therapy, Case management,  1 to 1 session with clinician, Psychoeducation, Recreational therapy.   Physician Treatment Plan for Primary Diagnosis: Adjustment disorder with mixed anxiety and depressed mood Long Term Goal(s): Improvement in symptoms so as ready for discharge   Short Term Goals: Ability to identify changes in lifestyle to reduce recurrence of condition will improve Ability to verbalize feelings will improve  Medication Management: Evaluate patient's response, side effects, and tolerance of medication regimen.  Therapeutic Interventions: 1 to 1 sessions, Unit Group sessions and Medication administration.  Evaluation of Outcomes: Not Progressing  Physician Treatment Plan for Secondary Diagnosis: Principal Problem:   Adjustment disorder with mixed anxiety and depressed mood  Long Term Goal(s): Improvement in symptoms so as ready for discharge   Short Term Goals: Ability to identify changes in lifestyle to reduce recurrence of condition will improve Ability to verbalize feelings will improve     Medication Management: Evaluate  patient's response, side effects, and tolerance of medication regimen.  Therapeutic Interventions: 1 to 1 sessions, Unit Group sessions and Medication administration.  Evaluation of Outcomes: Not Progressing   RN Treatment Plan for Primary Diagnosis: Adjustment disorder with mixed anxiety and depressed mood Long Term Goal(s): Knowledge of disease and therapeutic regimen to maintain health will improve  Short Term Goals: Ability to remain free from injury will improve, Ability to verbalize frustration and anger appropriately will  improve, Ability to demonstrate self-control, Ability to participate in decision making will improve, Ability to verbalize feelings will improve, Ability to disclose and discuss suicidal ideas, Ability to identify and develop effective coping behaviors will improve, and Compliance with prescribed medications will improve  Medication Management: RN will administer medications as ordered by provider, will assess and evaluate patient's response and provide education to patient for prescribed medication. RN will report any adverse and/or side effects to prescribing provider.  Therapeutic Interventions: 1 on 1 counseling sessions, Psychoeducation, Medication administration, Evaluate responses to treatment, Monitor vital signs and CBGs as ordered, Perform/monitor CIWA, COWS, AIMS and Fall Risk screenings as ordered, Perform wound care treatments as ordered.  Evaluation of Outcomes: Not Progressing   LCSW Treatment Plan for Primary Diagnosis: Adjustment disorder with mixed anxiety and depressed mood Long Term Goal(s): Safe transition to appropriate next level of care at discharge, Engage patient in therapeutic group addressing interpersonal concerns.  Short Term Goals: Engage patient in aftercare planning with referrals and resources, Increase social support, Increase ability to appropriately verbalize feelings, Increase emotional regulation, Facilitate acceptance of mental health diagnosis and concerns, Facilitate patient progression through stages of change regarding substance use diagnoses and concerns, Identify triggers associated with mental health/substance abuse issues, and Increase skills for wellness and recovery  Therapeutic Interventions: Assess for all discharge needs, 1 to 1 time with Social worker, Explore available resources and support systems, Assess for adequacy in community support network, Educate family and significant other(s) on suicide prevention, Complete Psychosocial Assessment,  Interpersonal group therapy.  Evaluation of Outcomes: Not Progressing   Progress in Treatment: Attending groups: Yes. Participating in groups: Yes. Taking medication as prescribed: No. Toleration medication: No. Family/Significant other contact made: No, will contact:  Dorothe Pea ( significant other ) [phone] Patient understands diagnosis: Yes. Discussing patient identified problems/goals with staff: Yes. Medical problems stabilized or resolved: Yes. Denies suicidal/homicidal ideation: Yes. Issues/concerns per patient self-inventory: No.    New problem(s) identified: No, Describe:  None reported   New Short Term/Long Term Goal(s) medication stabilization, elimination of SI thoughts, development of comprehensive mental wellness plan.    Patient Goals:  " to get better and feel better "   Discharge Plan or Barriers: Patient recently admitted. CSW will continue to follow and assess for appropriate referrals and possible discharge planning.    Reason for Continuation of Hospitalization: Anxiety Depression Medication stabilization Suicidal ideation Other; describe Grieving from the passing her mom recently.   Estimated Length of Stay: 3-4 days   Last 3 [place] Suicide Severity Risk Score: Flowsheet Row [facility] (Current[facility] ED from 11/29/2022 in [facility] Emergency Department at [facility]  C-SSRS RISK CATEGORY Error: Q7 should not be populated when Q6 is No High Risk No Risk       Last PHQ 2/9 Scores:    06/06/2022    8:34 AM 01/09/2022    1:57 PM 04/26/2019  9:37 AM  Depression screen PHQ 2/9  Decreased Interest 0 0 0  Down, Depressed, Hopeless 0 0 0  PHQ - 2 Score 0 0 0  Altered sleeping   0  Tired, decreased energy   1  Change in appetite   1  Feeling bad or failure about yourself    0  Trouble  concentrating   1  Moving slowly or fidgety/restless   0  Suicidal thoughts   0  PHQ-9 Score   3  Difficult doing work/chores   Not difficult at all    Scribe for Treatment Team: Isabella Bowens, Theresia Majors 07/03/2023 2:28 PM

## 2023-07-03 NOTE — [facility] Group Notes (Signed)
[facility] Group Notes:  (Nursing/MHT/Case Management/Adjunct)  Date:  07/03/2023  Time:  9:02 PM  Type of Therapy:   Wrap-up group  Participation Level:  Active  Participation Quality:  Appropriate  Affect:  Appropriate  Cognitive:  Appropriate  Insight:  Appropriate  Engagement in Group:  Engaged  Modes of Intervention:  Education  Summary of Progress/Problems: Pt goal to practice mindful positive thinking. Rated day 8.10.  Noah Delaine 07/03/2023, 9:02 PM

## 2023-07-03 NOTE — Progress Notes (Signed)
Pt denies SI/HI/AVH today. Pt presents with anxiety, declined to take propanolol stating she only takes prn. Provider notified. Q 15 minute checks ongoing

## 2023-07-03 NOTE — H&P (Signed)
Psychiatric Admission Assessment Adult  Patient Identification: [name] MRN:  [id number] Date of Evaluation:  07/03/2023 Chief Complaint:  MDD (major depressive disorder), recurrent severe, without psychosis (HCC) [F33.2] Principal Diagnosis: Adjustment disorder with mixed anxiety and depressed mood Diagnosis:  Principal Problem:   Adjustment disorder with mixed anxiety and depressed mood  CC: "I made a mistake"  Charlene Reed is a 27 y.o. female  with a past psychiatric history of unspecified anxiety, with no previous psychiatric hospitalizations, and no prior history of suicidal behaviors. Patient initially arrived to [facility] on 07/01/2023 reporting worsening depression of SI in setting of multiple psychosocial stressors, and admitted to [facility] under IVC on 07/02/2023 for acute safety concerns and crisis stabalization. She has no significant medical hx. EtOH was 331 on arrival to ED.   Collateral Information: Attempted to contact patient's boyfriend, Dorothe Pea, at [phone] at 2:00 PM. Left a HIPAA compliant voicemail.    HPI:  Patient is presenting after an incident on Thursday evening where she consumed four beers, became intoxicated, and had an emotionally charged conversation with her boyfriend. While under the influence, she expressed suicidal ideations and shared the grief she's been experiencing following her mother's death one week prior. This loss has been profoundly distressing for her, and she tearfully reflects on how different her life feels since her mother's passing. Concerned for her safety, her boyfriend encouraged her to seek help, which ultimately led to her arrival in the ED and getting IVCd. She clarifies that previous documentation suggesting ingestion of four propranolol tablets is inaccurate; she denies taking any medications during the incident and denies any past suicide attempts or history of self-harm.   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 She reports improvement in her anxiety symptoms with Zoloft over the past two months, although she was nonadherent, missing two doses prior to Thursday. She rarely uses propranolol and has not experienced any previous depressive episodes. Currently, she denies any active suicidal or homicidal ideations, as well as auditory or visual hallucinations. She describes ongoing stressors including her rigorous academic workload, job responsibilities, and recent bereavement, but she has no intent to harm herself.   Psychiatric ROS Mood Symptoms Denies any hx of depression. Reports only verbalizing HI when intoxicated, these thoughts have never been present at baseline.   Manic Symptoms Denies any history of expansive energy or mood consistent with manic/hypomanic episodes.  Anxiety Symptoms Hx of anxiety has been intermittent school related. This is her last year in grad school and has experienced worsening stress and worry over her grades.  Anxiety is specific to school, denies generalized anxiety.   Trauma Symptoms Denies  Psychosis Symptoms Denies any past or current history of hallucinations, paranoia, or delusional thought processes.   Past Psychiatric Hx: Started seeing telehealth NP through [facility]. Previous Psychiatric Diagnoses: GAD Current psychiatric medications: zoloft, trazodone, propanolol PRN for presentations Psychiatric medication history/compliance: lexapro identified on chart review, patient reports she never took it.  Psychiatric Hospitalization hx: never Psychotherapy hx: Neuromodulation history: No History of suicide: Denies any prior attempts History of homicide or aggression: Denies any prior attempts NSSIB hx: Never  Substance Abuse Hx: Alcohol: Reports drinking recently since mother's death, no prior hx of abuse Tobacco:  Denies Cannabis:Denies Other Illicit drugs:Denies Rx drug abuse: Denies Rehab hx: denies  Past Medical History: PCP:  Hyman Hopes, NP Medical Dx: denies Medications:  multivitamins Allergies: denies Hospitalizations: Denies Surgeries: Denies Trauma:Denies Seizures:Denies   LMP: 2 weeks prior to admission,  Contraceptives: no use of contraception, has plans to start a patch  Family Medical History: Father-- CAD  Family Psychiatric History: Psychiatric Dx: Denies Suicide Hx: Denies Violence/Aggression: Denies Substance use: Denies  Social History: Living Situation: Lives in [place], recently living on her own, before this living with mom before her passing 1 week ago. Education: In [employer] for NP, she has plans of completing in May 2025 Occupational hx: [employer] part time PRN work  Marital Status: Single Children: Denies Armed forces operational officer: Denies Hotel manager: Denies  Access to firearms: Denies   Total Time spent with patient: 1.5 hours  [place] Scale:  Flowsheet Row [facility] (Current) [facility] from 07[facility]01/2023 in [facility] Department [facility] ED from 11/29/2022 in [facility] Emergency Department at [facility]  C-SSRS RISK CATEGORY Error: Q7 should not be populated when Q6 is No High Risk No Risk        Tobacco Screening:  Social History   Tobacco Use  Smoking Status Never  Smokeless Tobacco Never    BH Tobacco Counseling     Are you interested in Tobacco Cessation Medications?  No, patient refused Counseled patient on smoking cessation:  Refused/Declined practical counseling Reason Tobacco Screening Not Completed: Patient Refused Screening       Social History:  Social History   Substance and Sexual Activity  Alcohol Use Yes   Alcohol/week: 6.0 standard drinks of alcohol   Types: 6 Shots of liquor per week     Social History   Substance and Sexual Activity  Drug Use  Never    Allergies:  No Active Allergies Lab Results:  Results for orders placed or performed during the hospital encounter of 07/01/23 (from the past 48 hour(s))  CBC with Differential     Status: Abnormal   Collection Time: 07/01/23 10:49 PM  Result Value Ref Range   WBC 10.0 4.0 - 10.5 K/uL   RBC 5.45 (H) 3.87 - 5.11 MIL/uL   Hemoglobin 17.2 (H) 12.0 - 15.0 g/dL   HCT 16.1 (H) 09.6 - 04.5 %   MCV 93.0 80.0 - 100.0 fL   MCH 31.6 26.0 - 34.0 pg   MCHC 33.9 30.0 - 36.0 g/dL   RDW 40.9 81.1 - 91.4 %   Platelets 491 (H) 150 - 400 K/uL   nRBC 0.0 0.0 - 0.2 %   Neutrophils Relative % 51 %   Neutro Abs 5.1 1.7 - 7.7 K/uL   Lymphocytes Relative 38 %   Lymphs Abs 3.8 0.7 - 4.0 K/uL   Monocytes Relative 5 %   Monocytes Absolute 0.5 0.1 - 1.0 K/uL   Eosinophils Relative 3 %   Eosinophils Absolute 0.3 0.0 - 0.5 K/uL   Basophils Relative 2 %   Basophils Absolute 0.2 (H) 0.0 - 0.1 K/uL   Immature Granulocytes 1 %   Abs Immature Granulocytes 0.08 (H) 0.00 - 0.07 K/uL    Comment: Performed at [facility], [address] [place], [place] [address]  Comprehensive metabolic panel     Status: Abnormal   Collection Time: 07/01/23 10:49 PM  Result Value Ref Range   Sodium 140 135 - 145 mmol/L   Potassium 3.4 (L) 3.5 - 5.1 mmol/L   Chloride 103 98 - 111 mmol/L   CO2 21 (L) 22 - 32 mmol/L   Glucose, Bld  110 (H) 70 - 99 mg/dL    Comment: Glucose reference range applies only to samples taken after fasting for at least 8 hours.   BUN 10 6 - 20 mg/dL   Creatinine, Ser 9.52 0.44 - 1.00 mg/dL   Calcium 8.9 8.9 - 84.1 mg/dL   Total Protein 8.3 (H) 6.5 - 8.1 g/dL   Albumin 4.5 3.5 - 5.0 g/dL   AST 42 (H) 15 - 41 U/L   ALT 35 0 - 44 U/L   Alkaline Phosphatase 86 38 - 126 U/L   Total Bilirubin 0.9 0.3 - 1.2 mg/dL   GFR, Estimated >32 >44 mL/min    Comment: (NOTE) Calculated using the CKD-EPI Creatinine Equation (2021)    Anion gap 16 (H) 5 - 15    Comment: Performed at  [facility], [address] [place], [place] [address]  Ethanol     Status: Abnormal   Collection Time: 07/01/23 10:49 PM  Result Value Ref Range   Alcohol, Ethyl (B) 331 (HH) <10 mg/dL    Comment: CRITICAL RESULT CALLED TO, READ BACK BY AND VERIFIED WITH johnson, j. rn at 0057 on 07/02/23. fa (NOTE) Lowest detectable limit for serum alcohol is 10 mg/dL.  For medical purposes only. Performed at [facility], [address], [place], [place] [address]   Salicylate level     Status: Abnormal   Collection Time: 07/01/23 10:49 PM  Result Value Ref Range   Salicylate Lvl <7.0 (L) 7.0 - 30.0 mg/dL    Comment: Performed at [facility], [address], [place], [place] [address]  Acetaminophen level     Status: Abnormal   Collection Time: 07/01/23 10:49 PM  Result Value Ref Range   Acetaminophen (Tylenol), Serum <10 (L) 10 - 30 ug/mL    Comment: (NOTE) Therapeutic concentrations vary significantly. A range of 10-30 ug/mL  may be an effective concentration for many patients. However, some  are best treated at concentrations outside of this range. Acetaminophen concentrations >150 ug/mL at 4 hours after ingestion  and >50 ug/mL at 12 hours after ingestion are often associated with  toxic reactions.  Performed at [facility], [address], [place], [place] [address]   hCG, serum, qualitative     Status: None   Collection Time: 07/01/23 10:49 PM  Result Value Ref Range   Preg, Serum NEGATIVE NEGATIVE    Comment:        THE SENSITIVITY OF THIS METHODOLOGY IS >10 mIU/mL. Performed at [facility], [address] [place], [place] [address]     Blood Alcohol level:  Lab Results  Component Value Date   ETH 331 [facility]) 07/01/2023    Metabolic Disorder Labs:  Lab Results  Component Value Date   HGBA1C 5.2 12/12/2022   No results found for: "PROLACTIN" Lab Results  Component Value  Date   CHOL 191 01/09/2022   TRIG 58.0 01/09/2022   HDL 86.50 01/09/2022   CHOLHDL 2 01/09/2022   VLDL 11.6 01/09/2022   LDLCALC 93 01/09/2022    Current Medications: Current Facility-Administered Medications  Medication Dose Route Frequency Provider Last Rate Last Admin   acetaminophen (TYLENOL) tablet 650 mg  650 mg Oral Q6H PRN Motley-Mangrum, Jadeka A, PMHNP       alum & mag hydroxide-simeth (MAALOX/MYLANTA) 200-200-20 MG/5ML suspension 30 mL  30 mL Oral Q4H PRN Motley-Mangrum, Jadeka A, PMHNP       diphenhydrAMINE (BENADRYL) capsule 50 mg  50 mg Oral TID PRN Motley-Mangrum, Geralynn Ochs A, PMHNP       Or   diphenhydrAMINE (BENADRYL) injection 50 mg  50 mg Intramuscular TID PRN Motley-Mangrum, Jadeka A, PMHNP       haloperidol (HALDOL) tablet 5 mg  5 mg Oral TID PRN Motley-Mangrum, Jadeka A, PMHNP       Or   haloperidol lactate (HALDOL) injection 5 mg  5 mg Intramuscular TID PRN Motley-Mangrum, Geralynn Ochs A, PMHNP       hydrOXYzine (ATARAX) tablet 25 mg  25 mg Oral TID PRN Motley-Mangrum, Jadeka A, PMHNP       LORazepam (ATIVAN) tablet 2 mg  2 mg Oral TID PRN Motley-Mangrum, Jadeka A, PMHNP       Or   LORazepam (ATIVAN) injection 2 mg  2 mg Intramuscular TID PRN Motley-Mangrum, Jadeka A, PMHNP       magnesium hydroxide (MILK OF MAGNESIA) suspension 30 mL  30 mL Oral Daily PRN Motley-Mangrum, Jadeka A, PMHNP       sertraline (ZOLOFT) tablet 50 mg  50 mg Oral QHS Carrion-Carrero, Cyndi Montejano, MD       traZODone (DESYREL) tablet 25 mg  25 mg Oral QHS,MR X 1 Carrion-Carrero, Wendee Hata, MD       PTA Medications: Medications Prior to Admission  Medication Sig Dispense Refill Last Dose   propranolol (INDERAL) 10 MG tablet Take 1-2 tablets by mouth 60 minutes prior to presentation 20 tablet 1 Past Month   sertraline (ZOLOFT) 25 MG tablet Take 25 mg by mouth at bedtime.   Past Month   traZODone (DESYREL) 50 MG tablet Take 50 mg by mouth at bedtime as needed.   Past Month   tretinoin (RETIN-A) 0.025 %  cream Apply 1 Application topically at bedtime.   Past Month    Musculoskeletal: Strength & Muscle Tone: within normal limits Gait & Station: normal Patient leans: N/A  Psychiatric Specialty Exam:  Presentation  General Appearance:  Appropriate for Environment; Casual  Eye Contact: Good  Speech: Clear and Coherent; Normal Rate  Speech Volume: Normal  Handedness: -- (not assessed)   Mood and Affect  Mood: -- ("Anxious")  Affect: Appropriate; Full Range; Congruent   Thought Process  Thought Processes: Coherent; Goal Directed; Linear  Descriptions of Associations: Intact  Orientation: -- (grossly intact)  Thought Content: Logical; WDL  History of Schizophrenia/Schizoaffective disorder:No Duration of Psychotic Symptoms:N/A Hallucinations: Hallucinations: None  Ideas of Reference: None  Suicidal Thoughts: Suicidal Thoughts: No  Homicidal Thoughts: Homicidal Thoughts: No   Sensorium  Memory: Immediate Fair  Judgment: Fair  Insight: Fair   Art therapist  Concentration: Fair  Attention Span: Fair  Recall: [employer] of Knowledge: Fair  Language: Fair   Psychomotor Activity  Psychomotor Activity: Psychomotor Activity: Normal   Assets  Assets: Communication Skills; Desire for Improvement; Resilience   Sleep  Sleep: Sleep: Good    Physical Exam: Physical Exam Vitals and nursing note reviewed.  Constitutional:      General: She is not in acute distress.    Appearance: She is not ill-appearing.  HENT:     Head: Normocephalic and atraumatic.  Pulmonary:     Effort: Pulmonary effort is normal. No respiratory distress.  Musculoskeletal:        General: Normal range of motion.  Skin:    General: Skin is warm and dry.  Neurological:     General: No focal deficit present.     Mental Status: She is alert.    Review of Systems  All other systems reviewed and are negative.  Blood pressure 114/80, pulse  (!) 110, temperature 98.2 F (36.8 C), temperature source Oral, resp. rate 16, height 5' (1.524 m), weight 57.2 kg, SpO2 96%. Body mass index is 24.61 kg/m.   Treatment Plan Summary: Daily contact with patient to assess and evaluate symptoms and progress in treatment and Medication management   ASSESSMENT:  Charlene Reed is a 27 y.o. female  with a past psychiatric history of unspecified anxiety, with no previous psychiatric hospitalizations, and no prior history of suicidal behaviors. Patient initially arrived to [facility] on 07/01/2023 reporting worsening depression of SI in setting of multiple psychosocial stressors, and admitted to [facility] under IVC on 07/02/2023 for acute safety concerns and crisis stabalization. She has no significant medical hx.    Diagnoses / Active Problems: Adjustment disorder with mixed anxiety and depressed mood Alcohol use   PLAN: Safety and Monitoring:  --  INVOLUNTARY  admission to inpatient psychiatric unit for safety, stabilization and treatment  -- Daily contact with patient to assess and evaluate symptoms and progress in treatment  -- Patient's case to be discussed in multi-disciplinary team meeting  -- Observation Level : q15 minute checks  -- Vital signs:  q12 hours  -- Precautions: suicide, elopement, and assault  2. Psychiatric Diagnoses and Treatment:  Start Zoloft 50 mg nightly (home dose was 25 mg) treatment of depression and anxiety Start trazodone 25 mg nightly and repeat 1X as needed -- The risks/benefits/side-effects/alternatives to this medication were discussed in detail with the patient and time was given for questions. The patient consents to medication trial.  BMI: 24.61 kg/m TSH: pending QTc: 422 on 07/01/2023             -- Encouraged patient to participate in unit milieu and in scheduled group therapies   -- Short Term Goals: Ability to identify changes in lifestyle to reduce recurrence of condition will improve and Ability to  verbalize feelings will improve  -- Long Term Goals: Improvement in symptoms so as ready for discharge Other PRNS:  Tylenol 650 mg every 6 hours as needed Maalox Mylanta every 4 hours as needed Agitation protocol (Benadryl, Haldol, Ativan) Atarax 25 mg 3 times daily as needed  Milk of Magnesia daily as needed  Other labs reviewed on admission:  Urine pregnancy negative Acetaminophen <10 Salicylate <7.0 EtOH 331   3. Medical Issues Being Addressed:   Erythrocytosis Hgb 17.2 -PCP follow-up  Elevated LFTs -PCP follow-up -Downtrending, recent AST 106>42 ALT 104 >35  4. Discharge Planning:   -- Social work and case management to assist with discharge planning and identification of hospital follow-up needs prior to discharge  -- Estimated LOS: 5-7 days  -- Discharge Concerns: Need to establish a safety plan; Medication compliance and effectiveness  -- Discharge Goals: Return home with outpatient referrals for mental health follow-up including medication management/psychotherapy   I certify that inpatient services furnished can reasonably be expected to improve the patient's condition.   This note was created using a voice recognition software as a result there may be grammatical errors inadvertently enclosed that do not reflect the nature of this encounter. Every attempt is made to correct such errors.   Signed: Dr. Liston Alba, MD PGY-2, Psychiatry Residency  11/1/20242:44 PM

## 2023-07-03 NOTE — Plan of Care (Signed)
  Problem: Coping: Goal: Ability to verbalize frustrations and anger appropriately will improve Outcome: Progressing Goal: Ability to demonstrate self-control will improve Outcome: Progressing   Problem: Safety: Goal: Periods of time without injury will increase Outcome: Progressing   

## 2023-07-03 NOTE — [facility] Suicide Risk Assessment (Signed)
Suicide Risk Assessment  Admission Assessment    [facility] Admission Suicide Risk Assessment   Nursing information obtained from:  Patient Demographic factors:  NA Current Mental Status:  NA Loss Factors:  Loss of significant relationship Historical Factors:  NA Risk Reduction Factors:  Employed, Positive social support  Total Time spent with patient: 1.5 hours Principal Problem: MDD (major depressive disorder), recurrent severe, without psychosis (HCC) Diagnosis:  Principal Problem:   MDD (major depressive disorder), recurrent severe, without psychosis (HCC)   Subjective Data:   CC: "I made a mistake"   Charlene Reed is a 27 y.o. female  with a past psychiatric history of unspecified anxiety, with no previous psychiatric hospitalizations, and no prior history of suicidal behaviors. Patient initially arrived to [facility] on 07/01/2023 reporting worsening depression of SI in setting of multiple psychosocial stressors, and admitted to [facility] under IVC on 07/02/2023 for acute safety concerns and crisis stabalization. She has no significant medical hx. EtOH was 331 on arrival to ED.    Collateral Information: Attempted to contact patient's boyfriend, Dorothe Pea, at [phone] at 2:00 PM. Left a HIPAA compliant voicemail.     HPI:  Patient is presenting after an incident on Thursday evening where she consumed four beers, became intoxicated, and had an emotionally charged conversation with her boyfriend. While under the influence, she expressed suicidal ideations and shared the grief she's been experiencing following her mother's death one week prior. This loss has been profoundly distressing for her, and she tearfully reflects on how different her life feels since her mother's passing. Concerned for her safety, her boyfriend encouraged her to seek help, which ultimately led to her arrival in the ED and getting IVCd. She clarifies that previous documentation suggesting ingestion of four propranolol  tablets is inaccurate; she denies taking any medications during the incident and denies any past suicide attempts or history of self-harm.   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 She reports improvement in her anxiety symptoms with Zoloft over the past two months, although she was nonadherent, missing two doses prior to Thursday. She rarely uses propranolol and has not experienced any previous depressive episodes. Currently, she denies any active suicidal or homicidal ideations, as well as auditory or visual hallucinations. She describes ongoing stressors including her rigorous academic workload, job responsibilities, and recent bereavement, but she has no intent to harm herself.     Psychiatric ROS Mood Symptoms Denies any hx of depression. Reports only verbalizing HI when intoxicated, these thoughts have never been present at baseline.    Manic Symptoms Denies any history of expansive energy or mood consistent with manic/hypomanic episodes.   Anxiety Symptoms Hx of anxiety has been intermittent school related. This is her last year in grad school and has experienced worsening stress and worry over her grades.  Anxiety is specific to school, denies generalized anxiety.    Trauma Symptoms Denies   Psychosis Symptoms Denies any past or current history of hallucinations, paranoia, or delusional thought processes.     Past Psychiatric Hx: Started [facility]. Previous Psychiatric Diagnoses: GAD Current psychiatric medications: zoloft, trazodone, propanolol PRN for presentations Psychiatric medication history/compliance: lexapro identified on chart review, patient reports she never took it.  Psychiatric Hospitalization hx: never Psychotherapy hx: Neuromodulation history: No History of suicide: Denies any prior attempts History  of homicide or aggression: Denies any prior attempts  NSSIB hx: Never   Substance Abuse Hx: Alcohol: Reports drinking recently since mother's death, no prior hx of abuse Tobacco: Denies Cannabis:Denies Other Illicit drugs:Denies Rx drug abuse: Denies Rehab hx: denies   Past Medical History: PCP:  Hyman Hopes, NP Medical Dx: denies Medications: multivitamins Allergies: denies Hospitalizations: Denies Surgeries: Denies Trauma:Denies Seizures:Denies     LMP: 2 weeks prior to admission,  Contraceptives: no use of contraception, has plans to start a patch   Family Medical History: Father-- CAD   Family Psychiatric History: Psychiatric Dx: Denies Suicide Hx: Denies Violence/Aggression: Denies Substance use: Denies   Social History: Living Situation: Lives in [place], recently living on her own, before this living with mom before her passing 1 week ago. Education: In [employer] [employer] school for NP, she has plans of completing in May 2025 Occupational hx: dos part time PRN work  Marital Status: Single Children: Denies Legal: Denies Hotel manager: Denies   Access to firearms: Denies  Continued Clinical Symptoms:  Alcohol Use Disorder Identification Test Final Score (AUDIT): 6 The "Alcohol Use Disorders Identification Test", Guidelines for Use in Primary Care, Second Edition.  World Science writer [facility]). Score between 0-7:  no or low risk or alcohol related problems. Score between 8-15:  moderate risk of alcohol related problems. Score between 16-19:  high risk of alcohol related problems. Score 20 or above:  warrants further diagnostic evaluation for alcohol dependence and treatment.   CLINICAL FACTORS:   Severe Anxiety and/or Agitation   Musculoskeletal: Strength & Muscle Tone: within normal limits Gait & Station: normal Patient leans: N/A   Psychiatric Specialty Exam:   Presentation  General Appearance:  Appropriate for Environment; Casual   Eye Contact: Good    Speech: Clear and Coherent; Normal Rate   Speech Volume: Normal   Handedness: -- (not assessed)     Mood and Affect  Mood: -- ("Anxious")   Affect: Appropriate; Full Range; Congruent     Thought Process  Thought Processes: Coherent; Goal Directed; Linear   Descriptions of Associations: Intact   Orientation: -- (grossly intact)   Thought Content: Logical; WDL   History of Schizophrenia/Schizoaffective disorder:No Duration of Psychotic Symptoms:N/A Hallucinations: Hallucinations: None   Ideas of Reference: None   Suicidal Thoughts: Suicidal Thoughts: No   Homicidal Thoughts: Homicidal Thoughts: No     Sensorium  Memory: Immediate Fair   Judgment: Fair   Insight: Fair     Art therapist  Concentration: Fair   Attention Span: Fair   Recall: [employer] of Knowledge: Fair   Language: Fair     Psychomotor Activity  Psychomotor Activity: Psychomotor Activity: Normal     Assets  Assets: Communication Skills; Desire for Improvement; Resilience     Sleep  Sleep: Sleep: Good       Physical Exam: Physical Exam Vitals and nursing note reviewed.  Constitutional:      General: She is not in acute distress.    Appearance: She is not ill-appearing.  HENT:     Head: Normocephalic and atraumatic.  Pulmonary:     Effort: Pulmonary effort is normal. No respiratory distress.  Musculoskeletal:        General: Normal range of motion.  Skin:    General: Skin is warm and dry.  Neurological:     General: No focal deficit present.     Mental Status: She is alert.      Review of Systems  All other systems reviewed and are negative.   Blood pressure 114/80, pulse Marland Kitchen)  110, temperature 98.2 F (36.8 C), temperature source Oral, resp. rate 16, height 5' (1.524 m), weight 57.2 kg, SpO2 96%. Body mass index is 24.61 kg/m.   COGNITIVE FEATURES THAT CONTRIBUTE TO RISK:  None    SUICIDE RISK:   Mild:  Suicidal ideation of  limited frequency, intensity, duration, and specificity.  There are no identifiable plans, no associated intent, mild dysphoria and related symptoms, good self-control (both objective and subjective assessment), few other risk factors, and identifiable protective factors, including available and accessible social support.  PLAN OF CARE: See H&P for assessment and plan.   I certify that inpatient services furnished can reasonably be expected to improve the patient's condition.   Lorri Frederick, MD 07/03/2023, 8:17 AM

## 2023-07-03 NOTE — [facility] Counselor (Signed)
Adult Comprehensive Assessment  Patient ID: Charlene Reed, female   DOB: Jul 26, 1996, 27 y.o.   MRN: [id number]  Information Source: Information source: Patient  Current Stressors:  Patient states their primary concerns and needs for treatment are:: " I was drunk and made a statement about wanting to harm myself " Patient states their goals for this hospitilization and ongoing recovery are:: " get the help I need and a break from everything else " Educational / Learning stressors: " it is challenging being in NP school " Employment / Job issues: " trying to fit hours in between school " Family Relationships: None reported Surveyor, quantity / Lack of resources (include bankruptcy): None reported Housing / Lack of housing: None reported Physical health (include injuries & life threatening diseases): None reported Social relationships: None reported Substance abuse: None reported Bereavement / Loss: " my mom passing last week "  Living/Environment/Situation:  Living Arrangements: Alone Living conditions (as described by patient or guardian): Pt lives in a home Who else lives in the home?: alone How long has patient lived in current situation?: all my life " What is atmosphere in current home: Comfortable  Family History:  Marital status: Long term relationship Long term relationship, how long?: 2 1/2 years What types of issues is patient dealing with in the relationship?: None reported Additional relationship information: " he is very supportive " Are you sexually active?: Yes What is your sexual orientation?: Heterosexual Has your sexual activity been affected by drugs, alcohol, medication, or emotional stress?: None reported Does patient have children?: No  Childhood History:  By whom was/is the patient raised?: Mother Additional childhood history information: " my dad passed of a heart attack when I was 40 " Description of patient's relationship with caregiver when they were a child: "  Great " Patient's description of current relationship with people who raised him/her: " great until she recently passed " How were you disciplined when you got in trouble as a child/adolescent?: " talk to and time out " Does patient have siblings?: Yes Number of Siblings: 2 Description of patient's current relationship with siblings: Pt states that she does not have a close relationship with her oldest brother Did patient suffer any verbal/emotional/physical/sexual abuse as a child?: No Did patient suffer from severe childhood neglect?: No Has patient ever been sexually abused/assaulted/raped as an adolescent or adult?: No Was the patient ever a victim of a crime or a disaster?: No Witnessed domestic violence?: No Has patient been affected by domestic violence as an adult?: No  Education:  Highest grade of school patient has completed: Transport planner as a Charity fundraiser Currently a Consulting civil engineer?: Yes Name of school: [employer] How long has the patient attended?: 2 years Learning disability?: No  Employment/Work Situation:   Employment Situation: Employed 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?: No  Financial Resources:   Financial resources: Income from employment, Private insurance Does patient have a representative payee or guardian?: No  Alcohol/Substance Abuse:   What has been your use of drugs/alcohol within the last 12 months?: Only alcohol socially If attempted suicide, did drugs/alcohol play a role in this?: No Alcohol/Substance  Abuse Treatment Hx: Denies past history Has alcohol/substance abuse ever caused legal problems?: No  Social Support  System:   Patient's Community Support System: Good Describe Community Support System: Significant other , friends,and one of her brothers Type of faith/religion: Christian How does patient's faith help to cope with current illness?: " I don't go to church or anything "  Leisure/Recreation:   Do You Have Hobbies?: Yes Leisure and Hobbies: " hanging out with my boyfriend , going to the gym, reading, and go out to eat "  Strengths/Needs:   What is the patient's perception of their strengths?: " I am committed and reaching my goals " Patient states they can use these personal strengths during their treatment to contribute to their recovery: " working on myself and mental " Patient states these barriers may affect/interfere with their treatment: None reported Patient states these barriers may affect their return to the community: None reported Other important information patient would like considered in planning for their treatment: N/A  Discharge Plan:   Currently receiving community mental health services: Yes (From Whom) Patient states concerns and preferences for aftercare planning are: Pt wants to [facility] see [facility] Patient states they will know when they are safe and ready for discharge when: Once I am feeling better Does patient have access to transportation?: Yes Does patient have financial barriers related to discharge medications?: No Plan for living situation after discharge: Pt said that sh e may live with her significant other for a little or he may still with her in the house Will patient be returning to same living situation after discharge?: No  Summary/Recommendations:   Summary and Recommendations (to be completed by the evaluator): Charlene Reed is a 27 y/o female who was admitted to [facility] for SI due to major stressors of NP school , the loss of her mother , and trying to work her job. Patient during assessment was pleasant and seemed positive . Her eyes did look waterly  but not to the extent of tears falling down . Patient did shared that this was her first hospitalization and that she recently got connected with [facility] for mental health services. Patient will DC back to her mom house with significant other or go stay with her significant other once she is ready to be DC.While here, Charlene Reed can benefit from crisis stabilization, medication management, therapeutic milieu, and referrals for services.   Charlene Reed. 07/03/2023

## 2023-07-03 NOTE — Progress Notes (Signed)
   07/03/23 0300  Psych Admission Type (Psych Patients Only)  Admission Status Involuntary  Psychosocial Assessment  Patient Complaints Anxiety;Depression  Eye Contact Brief  Facial Expression Anxious;Sad  Affect Depressed  Speech Logical/coherent  Interaction Assertive  Motor Activity Other (Comment) (WDL)  Appearance/Hygiene Improved  Behavior Characteristics Calm;Cooperative;Appropriate to situation  Mood Depressed;Sad  Aggressive Behavior  Effect No apparent injury  Thought Process  Coherency WDL  Content WDL  Delusions None reported or observed  Perception WDL  Hallucination None reported or observed  Judgment WDL  Confusion None  Danger to Self  Current suicidal ideation? Denies  Agreement Not to Harm Self Yes  Description of Agreement verbal  Danger to Others  Danger to Others None reported or observed  Danger to Others Abnormal  Harmful Behavior to others No threats or harm toward other people  Destructive Behavior No threats or harm toward property

## 2023-07-03 NOTE — Plan of Care (Signed)

## 2023-07-03 NOTE — Group Note (Signed)
Recreation Therapy Group Note   Group Topic:Stress Management  Group Date: 07/03/2023 Start Time: 0930 End Time: 0955 Facilitators: Sandra Tellefsen-McCall, LRT,CTRS Location: 300 Hall Dayroom   Group Topic: Stress Management  Goal Area(s) Addresses:  Patient will identify positive stress management techniques. Patient will identify benefits of using stress management post d/c.  Activity: Meditation. LRT explained to patients the essence of meditation and to get as relaxed as possible. LRT played a meditation that guided patients to identify things that are holding them back from reaching the goals they see for themselves.   Education:  Stress Management, Discharge Planning.   Education Outcome: Acknowledges Education   Affect/Mood: Appropriate   Participation Level: Engaged   Participation Quality: Independent   Behavior: Appropriate   Speech/Thought Process: Focused   Insight: Good   Judgement: Good   Modes of Intervention: Meditation   Patient Response to Interventions:  Engaged   Education Outcome:  In group clarification offered    Clinical Observations/Individualized Feedback: Pt attended and participated in group session.      Plan: Continue to engage patient in RT group sessions 2-3x/week.   Dehlia Kilner-McCall, LRT,CTRS 07/03/2023 1:09 PM

## 2023-07-04 DIAGNOSIS — F4323 Adjustment disorder with mixed anxiety and depressed mood: Secondary | ICD-10-CM | POA: Diagnosis not present

## 2023-07-04 LAB — TSH: TSH: 3.565 u[IU]/mL (ref 0.350–4.500)

## 2023-07-04 MED ORDER — SERTRALINE HCL 100 MG PO TABS
100.0000 mg | ORAL_TABLET | Freq: Every day | ORAL | Status: DC
  Administered 2023-07-04: 100 mg via ORAL
  Filled 2023-07-04 (×3): qty 1

## 2023-07-04 NOTE — Progress Notes (Signed)
   07/04/23 9323  15 Minute Checks  Location Bedroom  Visual Appearance Calm  Behavior Sleeping  Sleep (Behavioral Health Patients Only)  Calculate sleep? (Click Yes once per 24 hr at 0600 safety check) Yes  Documented sleep last 24 hours 8

## 2023-07-04 NOTE — Progress Notes (Signed)
[facility] MD Progress Note  07/04/2023 1:22 PM Charlene Reed  MRN:  [id number]  Principal Problem: Adjustment disorder with mixed anxiety and depressed mood Diagnosis: Principal Problem:   Adjustment disorder with mixed anxiety and depressed mood   Reason for Admission:  Charlene Reed is a 27 y.o. female  with a past psychiatric history of unspecified anxiety, with no previous psychiatric hospitalizations, and no prior history of suicidal behaviors. Patient initially arrived to [facility] on 07/01/2023 reporting worsening depression of SI in setting of multiple psychosocial stressors, and admitted to [facility] under IVC on 07/02/2023 for acute safety concerns and crisis stabalization. She has no significant medical hx. EtOH was 331 on arrival to ED.  (admitted on 07/02/2023, total  LOS: 2 days )  Pertinent information discussed during bed progression: No acute events overnight  PRNs required overnight: None  Information Obtained Today During Patient Interview:  The patient was evaluated on the unit and reports good sleep with Trazodone 25 mg and an adequate appetite. She states her mood is "pretty good" today and denies any anxiety or depression, rating Zoloft positively. Goals for the day include participating in group activities, noting she has benefited from the courses. During the interview, she denies suicidal and homicidal ideations, as well as auditory or visual hallucinations, paranoid ideations, or delusional thoughts. She reports no side effects from current medications and has no somatic complaints, with regular bowel movements noted.  Collateral call placed on 07/04/2023 to patient's boyfriend, Dorothe Pea, at [phone] He reports that prior to admission the patient had been exhibiting worsening depression, she had recently been missing classes and had difficulty functioning in school and work.  He found the patient on the floor of her apartment, when he went to check on her with the patient's  brother.  He has remained in contact with the patient while she has been hospitalized, he reports that she appears improved.  He agrees with patient's plan for discharge tomorrow and has no safety concerns.  All questions were answered.   Past Psychiatric Hx: Started seeing telehealth NP through [facility]. Previous Psychiatric Diagnoses: GAD Current psychiatric medications: zoloft, trazodone, propanolol PRN for presentations Psychiatric medication history/compliance: lexapro identified on chart review, patient reports she never took it.  Psychiatric Hospitalization hx: never Psychotherapy hx: Neuromodulation history: No History of suicide: Denies any prior attempts History of homicide or aggression: Denies any prior attempts NSSIB hx: Never   Substance Abuse Hx: Alcohol: Reports drinking recently since mother's death, no prior hx of abuse Tobacco: Denies Cannabis:Denies Other Illicit drugs:Denies Rx drug abuse: Denies Rehab hx: denies   Past Medical History: PCP:  Hyman Hopes, NP Medical Dx: denies Medications: multivitamins Allergies: denies Hospitalizations: Denies Surgeries: Denies Trauma:Denies Seizures:Denies LMP: 2 weeks prior to admission,  Contraceptives: no use of contraception, has plans to start a patch   Family Medical History: Father-- CAD   Family Psychiatric History: Psychiatric Dx: Denies Suicide Hx: Denies Violence/Aggression: Denies Substance use: Denies   Social History: Living Situation: Lives in [place], recently living on her own, before this living with mom before her passing 1 week ago. Education: In [employer] for [employer], she has plans of completing in May 2025 Occupational hx: [employer] part time PRN work  Marital Status: Single Children: Denies Legal: Denies Hotel manager: Denies Access to firearms: Denies  Family History:  Family History  Problem Relation Age of Onset   Heart disease Father    Heart attack Father    High blood pressure Father  Breast cancer Maternal Grandmother     Current Medications: Current Facility-Administered Medications  Medication Dose Route Frequency Provider Last Rate Last Admin   acetaminophen (TYLENOL) tablet 650 mg  650 mg Oral Q6H PRN Motley-Mangrum, Jadeka A, PMHNP       alum & mag hydroxide-simeth (MAALOX/MYLANTA) 200-200-20 MG/5ML suspension 30 mL  30 mL Oral Q4H PRN Motley-Mangrum, Jadeka A, PMHNP       diphenhydrAMINE (BENADRYL) capsule 50 mg  50 mg Oral TID PRN Motley-Mangrum, Jadeka A, PMHNP       Or   diphenhydrAMINE (BENADRYL) injection 50 mg  50 mg Intramuscular TID PRN Motley-Mangrum, Jadeka A, PMHNP       haloperidol (HALDOL) tablet 5 mg  5 mg Oral TID PRN Motley-Mangrum, Jadeka A, PMHNP       Or   haloperidol lactate (HALDOL) injection 5 mg  5 mg Intramuscular TID PRN Motley-Mangrum, Geralynn Ochs A, PMHNP       hydrOXYzine (ATARAX) tablet 25 mg  25 mg Oral TID PRN Motley-Mangrum, Jadeka A, PMHNP       LORazepam (ATIVAN) tablet 2 mg  2 mg Oral TID PRN Motley-Mangrum, Jadeka A, PMHNP       Or   LORazepam (ATIVAN) injection 2 mg  2 mg Intramuscular TID PRN Motley-Mangrum, Jadeka A, PMHNP       magnesium hydroxide (MILK OF MAGNESIA) suspension 30 mL  30 mL Oral Daily PRN Motley-Mangrum, Jadeka A, PMHNP       sertraline (ZOLOFT) tablet 100 mg  100 mg Oral QHS Carrion-Carrero, Moncia Annas, MD       traZODone (DESYREL) tablet 25 mg  25 mg Oral QHS,MR X 1 Carrion-Carrero, Hildagarde Holleran, MD   25 mg at 07/03/23 2136    Lab Results:  Results for orders placed or performed during the hospital encounter of 07/02/23 (from the past 48 hour(s))  TSH     Status: None   Collection Time: 07/04/23  6:27 AM  Result Value Ref Range   TSH 3.565 0.350 - 4.500 uIU/mL    Comment: Performed by a 3rd Generation assay with a functional sensitivity of <=0.01 uIU/mL. Performed at [facility], [address] [place], [place] [address]     Blood Alcohol level:  Lab Results  Component Value Date   ETH  [facility]) 07/01/2023    Metabolic Labs: Lab Results  Component Value Date   HGBA1C 5.2 12/12/2022   No results found for: "PROLACTIN" Lab Results  Component Value Date   CHOL 191 01/09/2022   TRIG 58.0 01/09/2022   HDL 86.50 01/09/2022   CHOLHDL 2 01/09/2022   VLDL 11.6 01/09/2022   LDLCALC 93 01/09/2022    Physical Findings: AIMS: No  CIWA:    COWS:     Psychiatric Specialty Exam:  Presentation  General Appearance: Appropriate for Environment; Casual  Eye Contact:Good  Speech:Clear and Coherent; Normal Rate  Speech Volume:Normal  Handedness:-- (not assessed)   Mood and Affect  Mood: "Better"  Affect:Appropriate; Full Range; Congruent   Thought Process  Thought Processes:Coherent; Goal Directed; Linear  Descriptions of Associations:Intact  Orientation:-- (grossly intact)  Thought Content:Logical; WDL  History of Schizophrenia/Schizoaffective disorder:No Duration of Psychotic Symptoms: NA Hallucinations:Denies  Ideas of Reference:None  Suicidal Thoughts:Suicidal Thoughts: No  Homicidal Thoughts:Homicidal Thoughts: No   Sensorium  Memory:Immediate Fair  Judgment:Fair  Insight:Fair   Executive Functions  Concentration:Fair  Attention Span:Fair  Recall:Fair  Fund of Knowledge:Fair  Language:Fair   Psychomotor Activity  Psychomotor Activity:Psychomotor Activity: Normal   Assets  Assets:Communication Skills; Desire for Improvement; Resilience   Sleep  Sleep:Sleep: Good    Physical Exam: Physical Exam Vitals and nursing note reviewed.  Constitutional:      General: She is not in acute distress.    Appearance: She is not ill-appearing.  HENT:     Head: Normocephalic and atraumatic.  Pulmonary:     Effort: Pulmonary effort is normal. No respiratory distress.    Review of Systems  All other systems reviewed and are negative.  Blood pressure 110/70, pulse 80, temperature 98 F (36.7 C), temperature source Oral,  resp. rate 16, height 5' (1.524 m), weight 57.2 kg, SpO2 100%. Body mass index is 24.61 kg/m.  Treatment Plan Summary: Daily contact with patient to assess and evaluate symptoms and progress in treatment and Medication management   ASSESSMENT:   Charlene Reed is a 27 y.o. female  with a past psychiatric history of unspecified anxiety, with no previous psychiatric hospitalizations, and no prior history of suicidal behaviors. Patient [facility] on 07/01/[facility]23 reporting worsening depression of SI in setting of multiple psychosocial stressors, and admitted to [facility] under IVC on 07/02/2023 for acute safety concerns and crisis stabalization. She has no significant medical hx.      Diagnoses / Active Problems: Adjustment disorder with mixed anxiety and depressed mood Alcohol use     PLAN: Safety and Monitoring:             --  INVOLUNTARY  admission to inpatient psychiatric unit for safety, stabilization and treatment             -- Daily contact with patient to assess and evaluate symptoms and progress in treatment             -- Patient's case to be discussed in multi-disciplinary team meeting             -- Observation Level : q15 minute checks             -- Vital signs:  q12 hours             -- Precautions: suicide, elopement, and assault   2. Psychiatric Diagnoses and Treatment:  Increase Zoloft 50 mg to 100 mg nightly for treatment of depression and anxiety Start trazodone 25 mg nightly and repeat 1X as needed -- The risks/benefits/side-effects/alternatives to this medication were discussed in detail with the patient and time was given for questions. The patient consents to medication trial.  BMI: 24.61 kg/m TSH: WNL QTc: 422 on 07/01/2023             -- Encouraged patient to participate in unit milieu and in scheduled group therapies              -- Short Term Goals: Ability to identify changes in lifestyle to reduce recurrence of condition will improve and Ability  to verbalize feelings will improve             -- Long Term Goals: Improvement in symptoms so as ready for discharge Other PRNS:  Tylenol 650 mg every 6 hours as needed Maalox Mylanta every 4 hours as needed Agitation protocol (Benadryl, Haldol, Ativan) Atarax 25 mg 3 times daily as needed  Milk of Magnesia daily as needed   Other labs reviewed on admission:  Urine pregnancy negative Acetaminophen <10 Salicylate <7.0 EtOH 331              3. Medical Issues Being Addressed:    Erythrocytosis Hgb 17.2 -PCP  follow-up   Elevated LFTs -PCP follow-up -Downtrending, recent AST 106>42 ALT 104 >35   4. Discharge Planning:              -- Social work and case management to assist with discharge planning and identification of hospital follow-up needs prior to discharge             -- EDD: Home, Sunday 07/05/2023             -- Discharge Concerns: Need to establish a safety plan; Medication compliance and effectiveness             -- Discharge Goals: Return home with outpatient referrals for mental health follow-up including medication management/psychotherapy   Dr. Liston Alba, MD PGY-2, Psychiatry Residency  11/2/20241:22 PM

## 2023-07-04 NOTE — Progress Notes (Signed)
Patient A/O x 4. No c/o SI or HI.  Still noted with anxiety. Taking prescribed meds. Withdrawn from others. Being monitored q 15 min. Sleeping well. Remains safe at this time.

## 2023-07-04 NOTE — Plan of Care (Signed)
  Problem: Education: Goal: Emotional status will improve Outcome: Progressing   Problem: Activity: Goal: Interest or engagement in activities will improve Outcome: Progressing   

## 2023-07-04 NOTE — Plan of Care (Signed)
  Problem: Activity: Goal: Interest or engagement in activities will improve Outcome: Progressing   Problem: Education: Goal: Emotional status will improve 07/04/2023 0507 by Benjamyn Hestand, Sandria Manly, RN Outcome: Progressing 07/04/2023 0445 by Annalaura Sauseda, Sandria Manly, RN Outcome: Progressing   Problem: Education: Goal: Verbalization of understanding the information provided will improve Outcome: Progressing

## 2023-07-04 NOTE — [facility] Group Notes (Signed)
[facility] Group Notes:  (Nursing)  Date:  07/04/2023  Time:  1600  Type of Therapy:  Psychoeducational Skills  Participation Level:  Active  Participation Quality:  Appropriate and Attentive  Affect:  Anxious and Appropriate  Cognitive:  Appropriate  Insight:  Appropriate and Improving  Engagement in Group:  Engaged and Improving  Modes of Intervention:  Discussion, Exploration, Rapport Building, Socialization, and Support  Summary of Progress/Problems:  Charlene Reed 07/04/2023, 7:38 PM

## 2023-07-04 NOTE — [facility] Group Notes (Signed)
[facility] Group Notes:  (Nursing/MHT/Case Management/Adjunct)  Date:  07/04/2023  Time:  8:58 PM  Type of Therapy:   Wrap-up group  Participation Level:  Active  Participation Quality:  Appropriate  Affect:  Appropriate  Cognitive:  Appropriate  Insight:  Appropriate  Engagement in Group:  Engaged  Modes of Intervention:  Education  Summary of Progress/Problems: Pt goal to be active. Pt reports meeting her goal. Rated day 9/10.  Noah Delaine 07/04/2023, 8:58 PM

## 2023-07-04 NOTE — Progress Notes (Signed)
   07/04/23 1600  Psych Admission Type (Psych Patients Only)  Admission Status Involuntary  Psychosocial Assessment  Patient Complaints Anxiety;Depression  Eye Contact Fair  Facial Expression Anxious  Affect Depressed  Speech Logical/coherent  Interaction Assertive  Motor Activity Other (Comment) (WNL)  Appearance/Hygiene Unremarkable  Behavior Characteristics Cooperative  Mood Depressed;Anxious  Thought Process  Coherency WDL  Content WDL  Delusions None reported or observed  Perception WDL  Hallucination None reported or observed  Judgment WDL  Confusion None  Danger to Self  Current suicidal ideation? Denies  Agreement Not to Harm Self Yes  Description of Agreement verbal  Danger to Others  Danger to Others None reported or observed  Danger to Others Abnormal  Harmful Behavior to others No threats or harm toward other people  Destructive Behavior No threats or harm toward property

## 2023-07-05 MED ORDER — TRAZODONE HCL 50 MG PO TABS: 25.0000 mg | ORAL_TABLET | Freq: Every evening | ORAL | 0 refills | Status: DC | PRN

## 2023-07-05 MED ORDER — SERTRALINE HCL 100 MG PO TABS: 100.0000 mg | ORAL_TABLET | Freq: Every day | ORAL | 0 refills | Status: DC

## 2023-07-05 NOTE — Discharge Summary (Signed)
Physician Discharge Summary Note  Patient:  Charlene Reed is an 27 y.o., female MRN:  [id number] DOB:  1996/02/12 Patient phone:  [phone] (home)  Patient address:   [address] [place] [address],  Total Time spent with patient: 20 minutes  Date of Admission:  07/02/2023 Date of Discharge: 07/05/23    Subjective on Day of Discharge:  Patient was evaluated this morning.  Reports symptoms of depression and anxiety are minimal, significantly improved from admission.  No suicidal ideations, no homicidal ideations, no symptoms of psychosis.  She reports being motivated to follow-up with outpatient psychiatric services including grief counseling.  The patient was provided with instructions to arrange outpatient appointments (resources provided in discharge paperwork) on Monday, and was strongly encouraged to have medication management with an in person provider.  Reason for Admission:   Charlene Reed is a 27 y.o. female  with a past psychiatric history of unspecified anxiety, with no previous psychiatric hospitalizations, and no prior history of suicidal behaviors. Patient initially arrived to [facility] on 07/01/2023 reporting worsening depression of SI in setting of multiple psychosocial stressors, and admitted to [facility] under IVC on 07/02/2023 for acute safety concerns and crisis stabalization. She has no significant medical hx. EtOH was 331 on arrival to ED.  The patient was notified that she would have to make the appointments Principal Problem: Adjustment disorder with mixed anxiety and depressed mood Discharge Diagnoses: Principal Problem:   Adjustment disorder with mixed anxiety and depressed mood   Past Psychiatric Hx: [facility]ed [facility] Previous Psychiatric Diagnoses: GAD Current psychiatric medications: zoloft, trazodone, propanolol PRN for presentations Psychiatric medication history/compliance: lexapro identified on chart review, patient reports  she never took it.  Psychiatric Hospitalization hx: never Psychotherapy hx: Neuromodulation history: No History of suicide: Denies any prior attempts History of homicide or aggression: Denies any prior attempts NSSIB hx: Never   Substance Abuse Hx: Alcohol: Reports drinking recently since mother's death, no prior hx of abuse Tobacco: Denies Cannabis:Denies Other Illicit drugs:Denies Rx drug abuse: Denies Rehab hx: denies   Past Medical History: PCP:  Hyman Hopes, NP Medical Dx: denies Medications: multivitamins Allergies: denies Hospitalizations: Denies Surgeries: Denies Trauma:Denies Seizures:Denies     LMP: 2 weeks prior to admission,  Contraceptives: no use of contraception, has plans to start a patch   Family Medical History: Father-- CAD   Family Psychiatric History: Psychiatric Dx: Denies Suicide Hx: Denies Violence/Aggression: Denies Substance use: Denies   Social History: Living Situation: Lives in [place], recently living on her own, before this living with mom before her passing 1 week ago. Education: In [place] school for NP, she has plans of completing in May 2025 Occupational hx: [employer] part time [employer] work  Marital Status: Single Children: Denies Legal: Denies Hotel manager: Denies   Access to firearms: Denies  Past Medical History: History reviewed. No pertinent past medical history.  History reviewed. No pertinent surgical history. Family History:  Family History  Problem Relation Age of Onset   Heart disease Father    Heart attack Father    High blood pressure Father    Breast cancer Maternal Grandmother    Social History:  Social History   Substance and Sexual Activity  Alcohol Use Yes   Alcohol/week: 6.0 standard drinks of alcohol   Types: 6 Shots of liquor per week     Social History   Substance and Sexual Activity  Drug Use Never    Social History   Socioeconomic History  Marital status: Single    Spouse name: Not on file   Number of  children: Not on file   Years of education: Not on file   Highest education level: Bachelor's degree (e.g., BA, AB, BS)  Occupational History   Not on file  Tobacco Use   Smoking status: Never   Smokeless tobacco: Never  Vaping Use   Vaping status: Never Used  Substance and Sexual Activity   Alcohol use: Yes    Alcohol/week: 6.0 standard drinks of alcohol    Types: 6 Shots of liquor per week   Drug use: Never   Sexual activity: Yes    Partners: Male    Birth control/protection: Condom  Other Topics Concern   Not on file  Social History Narrative   Not on file   Social Determinants of Health   Financial Resource Strain: Low Risk  (12/10/2022)   Overall Financial Resource Strain (CARDIA)    Difficulty of Paying Living Expenses: Not hard at all  Food Insecurity: No Food Insecurity (07/02/2023)   Hunger Vital Sign    Worried About Running Out of Food in the Last Year: Never true    Ran Out of Food in the Last Year: Never true  Transportation Needs: No Transportation Needs (07/02/2023)   PRAPARE - Administrator, Civil Service (Medical): No    Lack of Transportation (Non-Medical): No  Physical Activity: Sufficiently Active (12/10/2022)   Exercise Vital Sign    Days of Exercise per Week: 5 days    Minutes of Exercise per Session: 60 min  Stress: No Stress Concern Present (12/10/2022)   [employer] of Occupational Health - Occupational Stress Questionnaire    Feeling of Stress : Only a little  Social Connections: Moderately Integrated (12/10/2022)   Social Connection and Isolation Panel [NHANES]    Frequency of Communication with Friends and Family: More than three times a week    Frequency of Social Gatherings with Friends and Family: More than three times a week    Attends Religious Services: 1 to 4 times per year    Active Member of [employer] or Organizations: Yes    Attends Banker Meetings: 1 to 4 times per year    Marital Status: Never married     Hospital Course:   During the patient's hospitalization, patient had extensive initial psychiatric evaluation, and follow-up psychiatric evaluations every day.   Psychiatric diagnoses provided upon initial assessment:  Adjustment disorder with mixed anxiety and depressed mood Alcohol use Patient's psychiatric medications were adjusted on admission:  Patient was initially started on 50 mg nightly, and titrated to 100 mg nightly by end of hospitalizations Started trazodone 25 mg nightly for insomnia   During the hospitalization, other adjustments were made to the patient's psychiatric medication regimen: None   Patient's care was discussed during the interdisciplinary team meeting every day during the hospitalization.   The patient denies having side effects to prescribed psychiatric medication.   Gradually, patient started adjusting to milieu. The patient was evaluated each day by a clinical provider to ascertain response to treatment. Improvement was noted by the patient's report of decreasing symptoms, improved sleep and appetite, affect, medication tolerance, behavior, and participation in unit programming.  Patient was asked each day to complete a self inventory noting mood, mental status, pain, new symptoms, anxiety and concerns.     Symptoms were reported as significantly decreased or resolved completely by discharge.    On day of discharge, the patient reports  that their mood is stable. The patient denied having suicidal thoughts for more than 48 hours prior to discharge.  Patient denies having homicidal thoughts.  Patient denies having auditory hallucinations.  Patient denies any visual hallucinations or other symptoms of psychosis. The patient was motivated to continue taking medication with a goal of continued improvement in mental health.    The patient reports their target psychiatric symptoms of depression and anxiety responded well to the psychiatric medications, and the  patient reports overall benefit other psychiatric hospitalization. Supportive psychotherapy was provided to the patient. The patient also participated in regular group therapy while hospitalized. Coping skills, problem solving as well as relaxation therapies were also part of the unit programming.   Labs were reviewed with the patient, and abnormal results were discussed with the patient.   The patient is able to verbalize their individual safety plan to this provider.   # It is recommended to the patient to continue psychiatric medications as prescribed, after discharge from the hospital.     # It is recommended to the patient to follow up with your outpatient psychiatric provider and PCP.   # It was discussed with the patient, the impact of alcohol, drugs, tobacco have been there overall psychiatric and medical wellbeing, and total abstinence from substance use was recommended the patient.ed.   # Prescriptions provided or sent directly to preferred pharmacy at discharge. Patient agreeable to plan. Given opportunity to ask questions. Appears to feel comfortable with discharge.    # In the event of worsening symptoms, the patient is instructed to call the crisis hotline, 911 and or go to the nearest ED for appropriate evaluation and treatment of symptoms. To follow-up with primary care provider for other medical issues, concerns and or health care needs   # Patient was discharged Home with a plan to follow up as noted below.    Musculoskeletal: Strength & Muscle Tone: within normal limits Gait & Station: normal Patient leans: N/A   Psychiatric Specialty Exam:   Presentation  General Appearance: Appropriate for Environment; Casual   Eye Contact:Good   Speech:Clear and Coherent; Normal Rate   Speech Volume:Normal   Handedness:-- (not assessed)     Mood and Affect  Mood: "I am doing good"   Affect:Appropriate; Full Range; Congruent     Thought Process  Thought  Processes:Coherent; Goal Directed; Linear   Descriptions of Associations:Intact   Orientation: grossly intact   Thought Content:Logical; WDL   History of Schizophrenia/Schizoaffective disorder:No Duration of Psychotic Symptoms: NA Hallucinations:Denies   Ideas of Reference:None   Suicidal Thoughts:Suicidal Thoughts: No   Homicidal Thoughts:Homicidal Thoughts: No     Sensorium  Memory:Immediate Fair   Judgment:Fair   Insight:Fair     Executive Functions  Concentration:Fair   Attention Span:Fair   Recall:Fair   Fund of Knowledge:Fair   Language:Fair     Psychomotor Activity  Psychomotor Activity:Psychomotor Activity: Normal     Assets  Assets:Communication Skills; Desire for Improvement; Resilience     Sleep  Sleep:Sleep: Good       Physical Exam: Physical Exam Vitals and nursing note reviewed.  Constitutional:      General: She is not in acute distress.    Appearance: She is not ill-appearing.  HENT:     Head: Normocephalic and atraumatic.  Pulmonary:     Effort: Pulmonary effort is normal. No respiratory distress.      Review of Systems  All other systems reviewed and are negative.   Blood pressure 103/75,  pulse 78, temperature 98.1 F (36.7 C), temperature source Oral, resp. rate 16, height 5' (1.524 m), weight 57.2 kg, SpO2 98%. Body mass index is 24.61 kg/m.   Social History   Tobacco Use  Smoking Status Never  Smokeless Tobacco Never   Tobacco Cessation:  N/A, patient does not currently use tobacco products   Blood Alcohol level:  Lab Results  Component Value Date   ETH [facility]) 07/01/2023    Metabolic Disorder Labs:  Lab Results  Component Value Date   HGBA1C 5.2 12/12/2022   No results found for: "PROLACTIN" Lab Results  Component Value Date   CHOL 191 01/09/2022   TRIG 58.0 01/09/2022   HDL 86.50 01/09/2022   CHOLHDL 2 01/09/2022   VLDL 11.6 01/09/2022   LDLCALC 93 01/09/2022    See Psychiatric Specialty  Exam and Suicide Risk Assessment completed by Attending Physician prior to discharge.  Discharge destination:  Home  Is patient on multiple antipsychotic therapies at discharge:  No   Has Patient had three or more failed trials of antipsychotic monotherapy by history:  No  Recommended Plan for Multiple Antipsychotic Therapies: NA   Allergies as of 07/05/2023   No Active Allergies      Medication List     STOP taking these medications    propranolol 10 MG tablet Commonly known as: INDERAL       TAKE these medications      Indication  sertraline 100 MG tablet Commonly known as: ZOLOFT Take 1 tablet (100 mg total) by mouth at bedtime. What changed:  medication strength how much to take how to take this when to take this additional instructions Another medication with the same name was removed. Continue taking this medication, and follow the directions you see here.  Indication: Generalized Anxiety Disorder, Major Depressive Disorder   traZODone 50 MG tablet Commonly known as: DESYREL Take 0.5 tablets (25 mg total) by mouth at bedtime and may repeat dose one time if needed. What changed:  how much to take when to take this reasons to take this  Indication: Trouble Sleeping   tretinoin 0.025 % cream Commonly known as: RETIN-A Apply 1 Application topically at bedtime.  Indication: Common Acne         Follow-up Information     [facility]. Schedule an appointment as soon as possible for a visit.   Specialty: Hospice and Palliative Medicine Why: Please call this provider personally to schedule an appointment for grief/bereavement therapy services. Contact information: [address] [place] [address] [phone]        Leone Payor, FNP. Schedule an appointment as soon as possible for a visit.   Specialty: Psychiatry Why: Please call for an appointment for medication management.  The appointment would be best in  person. Contact information: [address] [place] [address] [phone]         [facility]. Schedule an appointment as soon as possible for a visit.   Why: Please make an appointment with your current therapist.  Your hospital treatment team believes it would be best to be seen in person. Contact information: [address] [place], [place] [address] Phone: [phone] Fax: [phone]                Plan Of Care/Follow-up recommendations:  Activity: as tolerated   Diet: heart healthy   Other: -Follow-up with your outpatient psychiatric provider -instructions on appointment date, time, and address (location) are  provided to you in discharge paperwork.   -Take your psychiatric medications as prescribed at discharge - instructions are provided to you in the discharge paperwork   -Follow-up with outpatient primary care doctor and other specialists -for management of preventative medicine and chronic medical disease, including: None   -Testing: Follow-up with outpatient provider for abnormal lab results: None   -Recommend abstinence from alcohol, tobacco, and other illicit drug use at discharge.    -If your psychiatric symptoms recur, worsen, or if you have side effects to your psychiatric medications, call your outpatient psychiatric provider, 911, 988 or go to the nearest emergency department.   -If suicidal thoughts recur, call your outpatient psychiatric provider, 911, 988 or go to the nearest emergency department.   Signed: Dr. Liston Alba, MD PGY-2, Psychiatry Residency  07/05/2023, 11:37 AM

## 2023-07-05 NOTE — Plan of Care (Signed)
  Problem: Coping: Goal: Ability to verbalize frustrations and anger appropriately will improve Outcome: Progressing Goal: Ability to demonstrate self-control will improve Outcome: Progressing   Problem: Health Behavior/Discharge Planning: Goal: Identification of resources available to assist in meeting health care needs will improve Outcome: Progressing Goal: Compliance with treatment plan for underlying cause of condition will improve Outcome: Progressing   

## 2023-07-05 NOTE — [facility] Suicide Risk Assessment (Signed)
[facility] INPATIENT:  Family/Significant Other Suicide Prevention Education  Suicide Prevention Education:  Education Completed; boyfriend Dorothe Pea [phone],  (name of family member/significant other) has been identified by the patient as the family member/significant other with whom the patient will be residing, and identified as the person(s) who will aid the patient in the event of a mental health crisis (suicidal ideations/suicide attempt).    Patient will be living with boyfriend.  He is going to give the Mobile Crisis number to all her family members.  He will pick her up at noon.  There are no firearms in the home or otherwise available to her.  With written consent from the patient, the family member/significant other has been provided the following suicide prevention education, prior to the and/or following the discharge of the patient.  The suicide prevention education provided includes the following: Suicide risk factors Suicide prevention and interventions National Suicide Hotline telephone number [facility] assessment telephone number [facility] Emergency Assistance 911 [facility] and/or Residential Mobile Crisis Unit telephone number  Request made of family/significant other to: Remove weapons (e.g., guns, rifles, knives), all items previously/currently identified as safety concern.   Remove drugs/medications (over-the-counter, prescriptions, illicit drugs), all items previously/currently identified as a safety concern.  The family member/significant other verbalizes understanding of the suicide prevention education information provided.  The family member/significant other agrees to remove the items of safety concern listed above.  Carloyn Jaeger Grossman-Orr 07/05/2023, 9:35 AM

## 2023-07-05 NOTE — Progress Notes (Signed)
  [facility] Adult Case Management Discharge Plan :  Will you be returning to the same living situation after discharge:  No.  Is going to live with bo yfriend At discharge, do you have transportation home?: Yes,  boyfriend to pick up Do you have the ability to pay for your medications: Yes,  insurance  Release of information consent forms completed and emailed to Medical Records, then turned in to Medical Records by CSW.   Patient to Follow up at:  Follow-up Information     [facility]. Schedule an appointment as soon as possible for a visit.   Specialty: Hospice and Palliative Medicine Why: Please call this provider personally to schedule an appointment for grief/bereavement therapy services. Contact information: [address] [place] [address] [phone]        Leone Payor, FNP. Schedule an appointment as soon as possible for a visit.   Specialty: Psychiatry Why: Please call for an appointment for medication management.  The appointment would be best in person. Contact information: [address] [place] [address] [phone]         [facility]. Schedule an appointment as soon as possible for a visit.   Why: Please make an appointment with your current therapist.  Your hospital treatment team believes it would be best to be seen in person. Contact information: [address] [place], [place] [address] Phone: [phone] Fax: [phone]                Next level of care provider has access to [facility] Link:no  Safety Planning and Suicide Prevention discussed: Yes,  with boyfriend Dorothe Pea [phone]   Has patient been referred to the Quitline?: Patient does not use tobacco/nicotine products  Patient has been referred for addiction treatment: No known substance use disorder.  Lynnell Chad, LCSW 07/05/2023, 9:16 AM

## 2023-07-05 NOTE — [facility] Suicide Risk Assessment (Signed)
Suicide Risk Assessment  Discharge Assessment    [facility] Discharge Suicide Risk Assessment   Principal Problem: Adjustment disorder with mixed anxiety and depressed mood Discharge Diagnoses: Principal Problem:   Adjustment disorder with mixed anxiety and depressed mood   Total Time spent with patient: 20 minutes  Charlene Reed is a 27 y.o. female  with a past psychiatric history of unspecified anxiety, with no previous psychiatric hospitalizations, and no prior history of suicidal behaviors. Patient initially arrived to [facility] on 07/01/2023 reporting worsening depression of SI in setting of multiple psychosocial stressors, and admitted to [facility] under IVC on 07/02/2023 for acute safety concerns and crisis stabalization. She has no significant medical hx. EtOH was 331 on arrival to ED.   During the patient's hospitalization, patient had extensive initial psychiatric evaluation, and follow-up psychiatric evaluations every day.  Psychiatric diagnoses provided upon initial assessment:  Adjustment disorder with mixed anxiety and depressed mood Alcohol use Patient's psychiatric medications were adjusted on admission:  Patient was initially started on 50 mg nightly, and titrated to 100 mg nightly by end of hospitalizations Started trazodone 25 mg nightly for insomnia  During the hospitalization, other adjustments were made to the patient's psychiatric medication regimen: None  Patient's care was discussed during the interdisciplinary team meeting every day during the hospitalization.  The patient denies having side effects to prescribed psychiatric medication.  Gradually, patient started adjusting to milieu. The patient was evaluated each day by a clinical provider to ascertain response to treatment. Improvement was noted by the patient's report of decreasing symptoms, improved sleep and appetite, affect, medication tolerance, behavior, and participation in unit programming.  Patient was asked each  day to complete a self inventory noting mood, mental status, pain, new symptoms, anxiety and concerns.    Symptoms were reported as significantly decreased or resolved completely by discharge.   On day of discharge, the patient reports that their mood is stable. The patient denied having suicidal thoughts for more than 48 hours prior to discharge.  Patient denies having homicidal thoughts.  Patient denies having auditory hallucinations.  Patient denies any visual hallucinations or other symptoms of psychosis. The patient was motivated to continue taking medication with a goal of continued improvement in mental health.   The patient reports their target psychiatric symptoms of depression and anxiety responded well to the psychiatric medications, and the patient reports overall benefit other psychiatric hospitalization. Supportive psychotherapy was provided to the patient. The patient also participated in regular group therapy while hospitalized. Coping skills, problem solving as well as relaxation therapies were also part of the unit programming.  Labs were reviewed with the patient, and abnormal results were discussed with the patient.  The patient is able to verbalize their individual safety plan to this provider.  # It is recommended to the patient to continue psychiatric medications as prescribed, after discharge from the hospital.    # It is recommended to the patient to follow up with your outpatient psychiatric provider and PCP.  # It was discussed with the patient, the impact of alcohol, drugs, tobacco have been there overall psychiatric and medical wellbeing, and total abstinence from substance use was recommended the patient.ed.  # Prescriptions provided or sent directly to preferred pharmacy at discharge. Patient agreeable to plan. Given opportunity to ask questions. Appears to feel comfortable with discharge.    # In the event of worsening symptoms, the patient is instructed to call  the crisis hotline, 911 and or go to the nearest ED  for appropriate evaluation and treatment of symptoms. To follow-up with primary care provider for other medical issues, concerns and or health care needs  # Patient was discharged Home with a plan to follow up as noted below.    Musculoskeletal: Strength & Muscle Tone: within normal limits Gait & Station: normal Patient leans: N/A  Psychiatric Specialty Exam:   Presentation  General Appearance: Appropriate for Environment; Casual   Eye Contact:Good   Speech:Clear and Coherent; Normal Rate   Speech Volume:Normal   Handedness:-- (not assessed)     Mood and Affect  Mood: "I am doing good"   Affect:Appropriate; Full Range; Congruent     Thought Process  Thought Processes:Coherent; Goal Directed; Linear   Descriptions of Associations:Intact   Orientation: grossly intact   Thought Content:Logical; WDL   History of Schizophrenia/Schizoaffective disorder:No Duration of Psychotic Symptoms: NA Hallucinations:Denies   Ideas of Reference:None   Suicidal Thoughts:Suicidal Thoughts: No   Homicidal Thoughts:Homicidal Thoughts: No     Sensorium  Memory:Immediate Fair   Judgment:Fair   Insight:Fair     Executive Functions  Concentration:Fair   Attention Span:Fair   Recall:Fair   Fund of Knowledge:Fair   Language:Fair     Psychomotor Activity  Psychomotor Activity:Psychomotor Activity: Normal     Assets  Assets:Communication Skills; Desire for Improvement; Resilience     Sleep  Sleep:Sleep: Good       Physical Exam: Physical Exam Vitals and nursing note reviewed.  Constitutional:      General: She is not in acute distress.    Appearance: She is not ill-appearing.  HENT:     Head: Normocephalic and atraumatic.  Pulmonary:     Effort: Pulmonary effort is normal. No respiratory distress.      Review of Systems  All other systems reviewed and are negative.  Blood pressure 103/75, pulse 78,  temperature 98.1 F (36.7 C), temperature source Oral, resp. rate 16, height 5' (1.524 m), weight 57.2 kg, SpO2 98%. Body mass index is 24.61 kg/m.  Mental Status Per Nursing Assessment::   On Admission:  NA  Demographic factors:  NA Current Mental Status:  NA Loss Factors:  Loss of significant relationship Historical Factors:  NA Risk Reduction Factors:  Employed, Positive social support   Continued Clinical Symptoms:  Previous Psychiatric Diagnoses and Treatments  Cognitive Features That Contribute To Risk:  None    Suicide Risk:  Mild: There are no identifiable suicide plans, no associated intent, mild dysphoria and related symptoms, good self-control (both objective and subjective assessment), few other risk factors, and identifiable protective factors, including available and accessible social support.   Follow-up Information     [facility]. Schedule an appointment as soon as possible for a visit.   Specialty: Hospice and Palliative Medicine Why: Please call this provider personally to schedule an appointment for grief/bereavement therapy services. Contact information: [address] [place] [address] [phone]        Leone Payor, FNP. Go to.   Specialty: Psychiatry Why: You have an appointment for medication management services on  .  The appointment will be held in person. Contact information: [address] [place] [address] [phone]                 Plan Of Care/Follow-up recommendations:  Activity: as tolerated  Diet: heart healthy  Other: -Follow-up with your outpatient psychiatric provider -instructions on appointment date, time, and address (location) are provided to you in discharge paperwork.  -Take  your psychiatric medications as prescribed at discharge - instructions are provided to you in the discharge paperwork  -Follow-up with outpatient primary care doctor and other  specialists -for management of preventative medicine and chronic medical disease, including: None  -Testing: Follow-up with outpatient provider for abnormal lab results: None  -Recommend abstinence from alcohol, tobacco, and other illicit drug use at discharge.   -If your psychiatric symptoms recur, worsen, or if you have side effects to your psychiatric medications, call your outpatient psychiatric provider, 911, [phone] or go to the nearest emergency department.  -If suicidal thoughts recur, call your outpatient psychiatric provider, 911, [phone] or go to the nearest emergency department.     Lorri Frederick, MD 07/05/2023, 6:56 AM

## 2023-07-05 NOTE — Progress Notes (Signed)
Pt discharged, leaving with all belongings and medications sent to pharmacy of choice. Pt denied SI/HI/AVH at time of discharge. No further questions/concerns voiced following discharge education.

## 2023-07-07 DIAGNOSIS — F331 Major depressive disorder, recurrent, moderate: Secondary | ICD-10-CM | POA: Diagnosis not present

## 2023-07-07 DIAGNOSIS — F4011 Social phobia, generalized: Secondary | ICD-10-CM | POA: Diagnosis not present

## 2023-07-07 DIAGNOSIS — F40248 Other situational type phobia: Secondary | ICD-10-CM | POA: Diagnosis not present

## 2023-07-07 DIAGNOSIS — F410 Panic disorder [episodic paroxysmal anxiety] without agoraphobia: Secondary | ICD-10-CM | POA: Diagnosis not present

## 2023-08-05 DIAGNOSIS — F411 Generalized anxiety disorder: Secondary | ICD-10-CM | POA: Diagnosis not present

## 2023-08-05 DIAGNOSIS — F40248 Other situational type phobia: Secondary | ICD-10-CM | POA: Diagnosis not present

## 2023-08-05 DIAGNOSIS — F331 Major depressive disorder, recurrent, moderate: Secondary | ICD-10-CM | POA: Diagnosis not present

## 2023-08-05 DIAGNOSIS — F410 Panic disorder [episodic paroxysmal anxiety] without agoraphobia: Secondary | ICD-10-CM | POA: Diagnosis not present

## 2023-09-18 ENCOUNTER — Ambulatory Visit
Admission: EM | Admit: 2023-09-18 | Discharge: 2023-09-18 | Disposition: A | Discharge status: Home / Self Care | Payer: BC Managed Care – PPO | Attending: Internal Medicine | Admitting: Internal Medicine

## 2023-09-18 ENCOUNTER — Ambulatory Visit
Admission: EM | Admit: 2023-09-18 | Discharge: 2023-09-18 | Discharge status: Against Medical Advice | Payer: BC Managed Care – PPO

## 2023-09-18 ENCOUNTER — Ambulatory Visit (INDEPENDENT_AMBULATORY_CARE_PROVIDER_SITE_OTHER): Payer: BC Managed Care – PPO | Admitting: Radiology

## 2023-09-18 VITALS — BP 143/89 | Resp 17

## 2023-09-18 DIAGNOSIS — J329 Chronic sinusitis, unspecified: Secondary | ICD-10-CM | POA: Diagnosis not present

## 2023-09-18 DIAGNOSIS — J209 Acute bronchitis, unspecified: Secondary | ICD-10-CM

## 2023-09-18 DIAGNOSIS — J40 Bronchitis, not specified as acute or chronic: Secondary | ICD-10-CM | POA: Diagnosis not present

## 2023-09-18 DIAGNOSIS — R0602 Shortness of breath: Secondary | ICD-10-CM

## 2023-09-18 DIAGNOSIS — Z3202 Encounter for pregnancy test, result negative: Secondary | ICD-10-CM

## 2023-09-18 DIAGNOSIS — R053 Chronic cough: Secondary | ICD-10-CM | POA: Diagnosis not present

## 2023-09-18 LAB — POCT URINE PREGNANCY: Preg Test, Ur: NEGATIVE

## 2023-09-18 MED ORDER — AMOXICILLIN-POT CLAVULANATE 875-125 MG PO TABS: 1.0000 | ORAL_TABLET | Freq: Two times a day (BID) | ORAL | 0 refills | Status: DC

## 2023-09-18 MED ORDER — AEROCHAMBER PLUS FLO-VU MEDIUM MISC
1.0000 | Freq: Once | Status: AC
  Administered 2023-09-18: 1

## 2023-09-18 MED ORDER — ALBUTEROL SULFATE HFA 108 (90 BASE) MCG/ACT IN AERS
2.0000 | INHALATION_SPRAY | Freq: Once | RESPIRATORY_TRACT | Status: AC
  Administered 2023-09-18: 2 via RESPIRATORY_TRACT

## 2023-09-18 MED ORDER — PROMETHAZINE-DM 6.25-15 MG/5ML PO SYRP: 5.0000 mL | ORAL_SOLUTION | Freq: Every evening | ORAL | 0 refills | Status: DC | PRN

## 2023-09-18 MED ORDER — DEXAMETHASONE SODIUM PHOSPHATE 10 MG/ML IJ SOLN
10.0000 mg | Freq: Once | INTRAMUSCULAR | Status: AC
  Administered 2023-09-18: 10 mg via INTRAMUSCULAR

## 2023-09-18 MED ORDER — PREDNISONE 20 MG PO TABS: 40.0000 mg | ORAL_TABLET | Freq: Every day | ORAL | 0 refills | Status: AC

## 2023-09-18 MED ORDER — ALBUTEROL SULFATE (2.5 MG/3ML) 0.083% IN NEBU
2.5000 mg | INHALATION_SOLUTION | Freq: Once | RESPIRATORY_TRACT | Status: AC
  Administered 2023-09-18: 2.5 mg via RESPIRATORY_TRACT

## 2023-09-18 NOTE — ED Triage Notes (Signed)
Attempted to call patient (#3) and Emergency contact @ [phone] , No Answer.

## 2023-09-18 NOTE — ED Triage Notes (Signed)
Attempted to call, No answer.  Patient checked in at [facility] and left.

## 2023-09-18 NOTE — ED Triage Notes (Signed)
Pt present with cough that has been present for 3 months. She was given a z pac 3 weeks ago but symptoms have gotten worse in last few days. States she has SOB, chest tightness, and severe congestion.

## 2023-09-18 NOTE — Discharge Instructions (Addendum)
Your evaluation shows you have a bacterial sinus infection plus a viral infection of the upper airways of your lungs. Use the following medicines to help with your symptoms:  - Take antibiotic sent to pharmacy as directed to treat sinus infection. - Take steroid sent to pharmacy as directed starting tomorrow. Do not take any other NSAID containing medications such as ibuprofen or naproxen/Aleve while taking prednisone. - You may use albuterol inhaler 1 to 2 puffs every 4-6 hours as needed for cough, shortness of breath, and wheezing. - Tessalon perles every 8 hours as needed for cough. - Take Promethazine DM cough medication to help with your cough at nighttime so that you are able to sleep. Do not drive, drink alcohol, or go to work while taking this medication since it can make you sleepy. Only take this at nighttime.  - Purchase Mucinex over the counter and take this every 12 hours as needed for nasal congestion and cough.  If you develop any new or worsening symptoms or do not improve in the next 2 to 3 days, please return.  If your symptoms are severe, please go to the emergency room.  Follow-up with your primary care provider for further evaluation and management of your symptoms as well as ongoing wellness visits.  I hope you feel better!  

## 2023-09-18 NOTE — ED Provider Notes (Addendum)
[facility]    CSN: [id number] Arrival date & time: 09/18/23  1116      History   Chief Complaint Chief Complaint  Patient presents with   Cough    Chest tightness - Entered by patient    HPI Charlene Reed is a 28 y.o. female.   Patient presents to urgent care for evaluation of persistent cough that started approximately 3 months ago.  Cough has been dry and persistent but worsened 4 weeks ago with shortness of breath and bilateral chest tightness. She went to urgent care 3 weeks ago where she was prescribed azithromycin antibiotic to treat possible pneumonia without chest x-ray. Azithromycin improved symptoms temporarily but shortness of breath and chest tightness with coughing returned a few days ago. She denies recent fevers, chills, nausea, vomiting, diarrhea, abdominal pain, rash, dizziness, headaches, and ear pain. Reports nasal congestion with bilateral maxillary pain that started approx. 10 days ago. Denies recent steroid use or recent sick contacts with similar symptoms. No history of chronic respiratory problems, never smoker, does not vape, and denies illicit drug use. She has been taking cough and cold medicines over the  counter without much relief.    Cough   History reviewed. No pertinent past medical history.  Patient Active Problem List   Diagnosis Date Noted   Suicidal ideation 07/02/2023   Adjustment disorder with mixed anxiety and depressed mood 07/02/2023   Grief 07/02/2023   Elevated LFTs 05/26/2023   Murmur 05/26/2023   Anxiety 05/26/2023   Performance anxiety 05/26/2023   Seasonal allergic rhinitis 06/06/2022   Acne vulgaris 06/11/2018   Family history of sudden cardiac death in father 10-25-12    History reviewed. No pertinent surgical history.  OB History   No obstetric history on file.      Home Medications    Prior to Admission medications   Medication Sig Start Date End Date Taking? Authorizing Provider   amoxicillin-clavulanate (AUGMENTIN) 875-125 MG tablet Take 1 tablet by mouth every 12 (twelve) hours. 09/18/23  Yes Carlisle Beers, FNP  predniSONE (DELTASONE) 20 MG tablet Take 2 tablets (40 mg total) by mouth daily with breakfast for 5 days. 09/18/23 09/23/23 Yes Carlisle Beers, FNP  promethazine-dextromethorphan (PROMETHAZINE-DM) 6.25-15 MG/5ML syrup Take 5 mLs by mouth at bedtime as needed for cough. 09/18/23  Yes Carlisle Beers, FNP  azithromycin (ZITHROMAX) 250 MG tablet Take 250 mg by mouth as directed. Patient not taking: Reported on 09/18/2023 09/11/23   [provider]  sertraline (ZOLOFT) 100 MG tablet Take 1 tablet (100 mg total) by mouth at bedtime. 07/05/23   Carrion-Carrero, Karle Starch, MD  sertraline (ZOLOFT) 50 MG tablet Take 50 mg by mouth daily. 08/16/23   [provider]  traZODone (DESYREL) 50 MG tablet Take 0.5 tablets (25 mg total) by mouth at bedtime and may repeat dose one time if needed. Patient not taking: Reported on 09/18/2023 07/05/23   Carrion-Carrero, Karle Starch, MD  tretinoin (RETIN-A) 0.025 % cream Apply 1 Application topically at bedtime.    [provider]    Family History Family History  Problem Relation Age of Onset   Heart disease Father    Heart attack Father    High blood pressure Father    Breast cancer Maternal Grandmother     Social History Social History   Tobacco Use   Smoking status: Never   Smokeless tobacco: Never  Vaping Use   Vaping status: Never Used  Substance Use Topics   Alcohol use:  Yes    Alcohol/week: 6.0 standard drinks of alcohol    Types: 6 Shots of liquor per week   Drug use: Never     Allergies   Patient has no known allergies.   Review of Systems Review of Systems  Respiratory:  Positive for cough.   Per HPI   Physical Exam Triage Vital Signs ED Triage Vitals  Encounter Vitals Group     BP 09/18/23 1122 (!) 143/89     Systolic BP Percentile --      Diastolic BP  Percentile --      Pulse --      Resp 09/18/23 1122 17     Temp --      Temp src --      SpO2 09/18/23 1122 96 %     Weight --      Height --      Head Circumference --      Peak Flow --      Pain Score 09/18/23 1125 2     Pain Loc --      Pain Education --      Exclude from Growth Chart --    No data found.  Updated Vital Signs BP (!) 143/89 (BP Location: Right Arm)   Resp 17   LMP 08/17/2023 (Approximate)   SpO2 96%   Visual Acuity Right Eye Distance:   Left Eye Distance:   Bilateral Distance:    Right Eye Near:   Left Eye Near:    Bilateral Near:     Physical Exam Vitals and nursing note reviewed.  Constitutional:      Appearance: She is not ill-appearing or toxic-appearing.  HENT:     Head: Normocephalic and atraumatic.     Right Ear: Hearing, tympanic membrane, ear canal and external ear normal.     Left Ear: Hearing, tympanic membrane, ear canal and external ear normal.     Nose: Congestion present.     Right Sinus: Maxillary sinus tenderness present.     Left Sinus: Maxillary sinus tenderness present.     Mouth/Throat:     Lips: Pink.     Mouth: Mucous membranes are moist. No injury or oral lesions.     Dentition: Normal dentition.     Tongue: No lesions.     Pharynx: Oropharynx is clear. Uvula midline. Posterior oropharyngeal erythema present. No pharyngeal swelling, oropharyngeal exudate, uvula swelling or postnasal drip.     Tonsils: No tonsillar exudate.  Eyes:     General: Lids are normal. Vision grossly intact. Gaze aligned appropriately.     Extraocular Movements: Extraocular movements intact.     Conjunctiva/sclera: Conjunctivae normal.  Neck:     Trachea: Trachea and phonation normal.  Cardiovascular:     Rate and Rhythm: Normal rate and regular rhythm.     Heart sounds: Normal heart sounds, S1 normal and S2 normal.  Pulmonary:     Effort: Pulmonary effort is normal. No respiratory distress.     Breath sounds: Normal air entry. Wheezing  present. No rhonchi or rales.     Comments: Diffuse inspiratory and expiratory wheezing heard all lung fields bilaterally. Chest:     Chest wall: No tenderness.  Musculoskeletal:     Cervical back: Neck supple.  Lymphadenopathy:     Cervical: Cervical adenopathy present.  Skin:    General: Skin is warm and dry.     Capillary Refill: Capillary refill takes less than 2 seconds.     Findings: No  rash.  Neurological:     General: No focal deficit present.     Mental Status: She is alert and oriented to person, place, and time. Mental status is at baseline.     Cranial Nerves: No dysarthria or facial asymmetry.  Psychiatric:        Mood and Affect: Mood normal.        Speech: Speech normal.        Behavior: Behavior normal.        Thought Content: Thought content normal.        Judgment: Judgment normal.     UC Treatments / Results  Labs (all labs ordered are listed, but only abnormal results are displayed) Labs Reviewed  POCT URINE PREGNANCY    EKG   Radiology No results found.  Procedures Procedures (including critical care time)  Medications Ordered in UC Medications  albuterol (VENTOLIN HFA) 108 (90 Base) MCG/ACT inhaler 2 puff (has no administration in time range)  AeroChamber Plus Flo-Vu Medium MISC 1 each (has no administration in time range)  albuterol (PROVENTIL) (2.5 MG/3ML) 0.083% nebulizer solution 2.5 mg (2.5 mg Nebulization Given 09/18/23 1148)  dexamethasone (DECADRON) injection 10 mg (10 mg Intramuscular Given 09/18/23 1147)    Initial Impression / Assessment and Plan / UC Course  I have reviewed the triage vital signs and the nursing notes.  Pertinent labs & imaging results that were available during my care of the patient were reviewed by me and considered in my medical decision making (see chart for details).   1.  Sinobronchitis, shortness of negative pregnancy test Evaluation suggests sinobronchitis etiology.   Chest x-ray ordered to rule out  focal consolidation/pneumonia given length of symptoms and concern for focal consolidation given increased incidence of walking pneumonia.  I will call if chest x-ray results indicate need for change in treatment plan.   Symptoms have been present for greater than 7 days, therefore will treat with Augmentin antibiotic as prescribed and short course of steroid. No NSAID with steroid, advised to take with food. Albuterol inhaler as needed.  Albuterol nebulizer provided in clinic with significant improvement in lung sounds and subjective shortness of breath. Prescriptions sent for further symptomatic relief, may continue OTC medications as needed.   Counseled patient on potential for adverse effects with medications prescribed/recommended today, strict ER and return-to-clinic precautions discussed, patient verbalized understanding.    Final Clinical Impressions(s) / UC Diagnoses   Final diagnoses:  Shortness of breath  Negative pregnancy test  Sinobronchitis     Discharge Instructions      Your evaluation shows you have a bacterial sinus infection plus a viral infection of the upper airways of your lungs. Use the following medicines to help with your symptoms:  - Take antibiotic sent to pharmacy as directed to treat sinus infection. - Take steroid sent to pharmacy as directed starting tomorrow. Do not take any other NSAID containing medications such as ibuprofen or naproxen/Aleve while taking prednisone. - You may use albuterol inhaler 1 to 2 puffs every 4-6 hours as needed for cough, shortness of breath, and wheezing. - Tessalon perles every 8 hours as needed for cough. - Take Promethazine DM cough medication to help with your cough at nighttime so that you are able to sleep. Do not drive, drink alcohol, or go to work while taking this medication since it can make you sleepy. Only take this at nighttime.  - Purchase Mucinex over the counter and take this every 12 hours as needed for  nasal  congestion and cough.  If you develop any new or worsening symptoms or do not improve in the next 2 to 3 days, please return.  If your symptoms are severe, please go to the emergency room.  Follow-up with your primary care provider for further evaluation and management of your symptoms as well as ongoing wellness visits.  I hope you feel better!       ED Prescriptions     Medication Sig Dispense Auth. Provider   predniSONE (DELTASONE) 20 MG tablet Take 2 tablets (40 mg total) by mouth daily with breakfast for 5 days. 10 tablet Carlisle Beers, FNP   promethazine-dextromethorphan (PROMETHAZINE-DM) 6.25-15 MG/5ML syrup Take 5 mLs by mouth at bedtime as needed for cough. 118 mL Reita May M, FNP   amoxicillin-clavulanate (AUGMENTIN) 875-125 MG tablet Take 1 tablet by mouth every 12 (twelve) hours. 14 tablet Carlisle Beers, FNP      PDMP not reviewed this encounter.   Carlisle Beers, FNP 09/18/23 1307    Carlisle Beers, FNP 09/18/23 1309

## 2023-09-18 NOTE — ED Triage Notes (Signed)
Attempted to call again.  No answer.

## 2023-10-06 ENCOUNTER — Ambulatory Visit: Payer: BC Managed Care – PPO | Admitting: Physician Assistant

## 2023-10-07 ENCOUNTER — Ambulatory Visit: Payer: Self-pay | Admitting: Physician Assistant

## 2023-10-14 ENCOUNTER — Ambulatory Visit: Payer: BC Managed Care – PPO | Admitting: Physician Assistant

## 2023-10-14 ENCOUNTER — Encounter: Payer: Self-pay | Admitting: Physician Assistant

## 2023-10-14 VITALS — BP 114/78 | HR 76 | Temp 98.2°F | Ht 60.0 in | Wt 130.0 lb

## 2023-10-14 DIAGNOSIS — F411 Generalized anxiety disorder: Secondary | ICD-10-CM | POA: Diagnosis not present

## 2023-10-14 DIAGNOSIS — D75839 Thrombocytosis, unspecified: Secondary | ICD-10-CM

## 2023-10-14 DIAGNOSIS — R7989 Other specified abnormal findings of blood chemistry: Secondary | ICD-10-CM | POA: Diagnosis not present

## 2023-10-14 DIAGNOSIS — R011 Cardiac murmur, unspecified: Secondary | ICD-10-CM

## 2023-10-14 DIAGNOSIS — Z13220 Encounter for screening for lipoid disorders: Secondary | ICD-10-CM

## 2023-10-14 MED ORDER — SERTRALINE HCL 100 MG PO TABS
100.0000 mg | ORAL_TABLET | Freq: Every day | ORAL | 1 refills | Status: DC
Start: 2023-10-14 — End: 2024-03-27

## 2023-10-14 NOTE — Progress Notes (Signed)
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  She would like a refill on sertraline. Previously seen by [facility] primary care.  Desires routine labs today.  In Sept 2024 her prior PCP mentioned a new incidental murmur. Patient has listened to her own heart and does not feel that the murmur is particularly prominent.  Echo was ordered but not completed. She remains asymptomatic, but has a family history of sudden cardiac death in her father age 5, uncertain of the precise etiology but has access to the records.  History of abnormal pap (LSIL with +HPV in 2023). Repeat pap last year in [place] not visible to me but according to patient it was NILM with -HPV. That said, they still recommended repeat in 1y.    Medication list has been reviewed and updated.  Current Meds  Medication Sig   tretinoin (RETIN-A) 0.025 % cream Apply 1 Application topically at bedtime.   [DISCONTINUED] sertraline (ZOLOFT) 100 MG tablet Take 1 tablet (100 mg total) by mouth at bedtime.     Review of Systems  Patient Active Problem List   Diagnosis Date Noted   Generalized anxiety disorder 10/14/2023   Suicidal ideation 07/02/2023   Adjustment disorder with mixed anxiety and depressed mood 07/02/2023   Grief 07/02/2023   Elevated LFTs 05/26/2023   Systolic murmur 05/26/2023   Anxiety 05/26/2023   Performance anxiety 05/26/2023   Seasonal allergic rhinitis 06/06/2022   Acne vulgaris 06/11/2018   Family history of sudden cardiac death in father 11/02/12    No Known Allergies  Immunization History  Administered Date(s) Administered   DTaP 05/12/1994, 07/17/1994, 09/18/1994,  09/24/1995, 03/27/1998   HIB (PRP-OMP) 05/12/1994, 07/17/1994, 09/18/1994, 09/24/1995   HIB, Unspecified 05/12/1994, 07/17/1994, 09/18/1994, 09/24/1995   HPV Quadrivalent 03/11/2006, 05/13/2006, 08/03/2007   Hepatitis A, Ped/Adol-2 Dose 03/11/2006, 08/03/2007   Hepatitis B, PED/ADOLESCENT 05/12/1994, 07/17/1994, 02/26/1995   IPV 05/12/1994, 07/17/1994, 09/18/1994, 03/27/1998   Influenza, Seasonal, Injecte, Preservative Fre 10/07/2012, 06/06/2016, 06/16/2017   Influenza,inj,Quad PF,6+ Mos 10/07/2012, 06/06/2016, 06/16/2017   Influenza-Unspecified 05/02/2018, 05/25/2023   MMR 05/21/1995, 03/27/1998   Meningococcal Acwy, Unspecified 10/07/2012   Meningococcal Conjugate 10/07/2012   PPD Test 10/30/2013, 09/30/2016   Tdap 03/11/2006, 01/11/2016    History reviewed. No pertinent surgical history.  Social History   Tobacco Use   Smoking status: Never   Smokeless tobacco: Never  Vaping Use   Vaping status: Never Used  Substance Use Topics   Alcohol use: Not Currently    Alcohol/week: 6.0 standard drinks of alcohol    Types: 6 Shots of liquor per week   Drug use: Never    Family History  Problem Relation Age of Onset   Varicose Veins Mother    Heart disease Father    Sudden Cardiac Death Father    High blood pressure Father    Hyperlipidemia Father    Hypertension Father    Breast cancer Maternal Grandmother         10/14/2023    1:58 PM 04/26/2019    9:37 AM 06/11/2018   10:08 AM  GAD 7 : Generalized Anxiety  Score  Nervous, Anxious, on Edge 0 0 2  Control/stop worrying 0 0 2  Worry too much - different things 0 0 2  Trouble relaxing 0 0 2  Restless 0 0 2  Easily annoyed or irritable 0 0 2  Afraid - awful might happen 0 0 2  Total GAD 7 Score 0 0 14  Anxiety Difficulty Not difficult at all  Very difficult       10/14/2023    1:58 PM 06/06/2022    8:34 AM 01/09/2022    1:57 PM  Depression screen PHQ 2/9  Decreased Interest 0 0 0  Down, Depressed, Hopeless  0 0   PHQ - 2 Score 0 0 0  Altered sleeping 0    Tired, decreased energy 0    Change in appetite 0    Feeling bad or failure about yourself  0    Trouble concentrating 0    Moving slowly or fidgety/restless 0    Suicidal thoughts 0    PHQ-9 Score 0    Difficult doing work/chores Not difficult at all      BP Readings from Last 3 Encounters:  10/14/23 114/78  09/18/23 (!) 143/89  07/02/23 106/65    Wt Readings from Last 3 Encounters:  10/14/23 130 lb (59 kg)  05/26/23 129 lb (58.5 kg)  02/24/23 137 lb (62.1 kg)    BP 114/78   Pulse 76   Temp 98.2 F (36.8 C)   Ht 5' (1.524 m)   Wt 130 lb (59 kg)   LMP 08/17/2023 (Approximate)   SpO2 96%   BMI 25.39 kg/m   Physical Exam Vitals and nursing note reviewed.  Constitutional:      Appearance: Normal appearance.  Neck:     Vascular: No carotid bruit.  Cardiovascular:     Rate and Rhythm: Normal rate and regular rhythm.     Heart sounds: Murmur (subtle) heard.     Systolic murmur is present with a grade of 1/6.     No friction rub. No gallop.  Pulmonary:     Effort: Pulmonary effort is normal.     Breath sounds: Normal breath sounds.  Abdominal:     General: There is no distension.  Musculoskeletal:        General: Normal range of motion.  Skin:    General: Skin is warm and dry.  Neurological:     Mental Status: She is alert and oriented to person, place, and time.     Gait: Gait is intact.  Psychiatric:        Mood and Affect: Mood and affect normal.     Recent Labs     Component Value Date/Time   NA 140 07/01/2023 2249   K 3.4 (L) 07/01/2023 2249   CL 103 07/01/2023 2249   CO2 21 (L) 07/01/2023 2249   GLUCOSE 110 (H) 07/01/2023 2249   BUN 10 07/01/2023 2249   CREATININE 0.71 07/01/2023 2249   CREATININE 0.77 06/11/2018 1113   CALCIUM 8.9 07/01/2023 2249   PROT 8.3 (H) 07/01/2023 2249   ALBUMIN 4.5 07/01/2023 2249   AST 42 (H) 07/01/2023 2249   ALT 35 07/01/2023 2249   ALKPHOS 86 07/01/2023 2249    BILITOT 0.9 07/01/2023 2249   GFRNONAA >60 07/01/2023 2249   GFRNONAA 108 06/11/2018 1113   GFRAA 125 06/11/2018 1113    Lab Results  Component Value Date   WBC 10.0 07/01/2023   HGB 17.2 (H) 07/01/2023   HCT  50.7 (H) 07/01/2023   MCV 93.0 07/01/2023   PLT 491 (H) 07/01/2023   Lab Results  Component Value Date   HGBA1C 5.2 12/12/2022   Lab Results  Component Value Date   CHOL 191 01/09/2022   HDL 86.50 01/09/2022   LDLCALC 93 01/09/2022   TRIG 58.0 01/09/2022   CHOLHDL 2 01/09/2022   Lab Results  Component Value Date   TSH 3.565 07/04/2023     Assessment and Plan:  1. Generalized anxiety disorder (Primary) Well-controlled per patient, satisfied with current sertraline dose, will refill for her. - sertraline (ZOLOFT) 100 MG tablet; Take 1 tablet (100 mg total) by mouth at bedtime.  Dispense: 90 tablet; Refill: 1  2. Systolic murmur Asymptomatic, very subtle.  Discussed with patient probably no need to proceed with echo at this time unless she simply desires a baseline read and reassurance of normal cardiac structure given the history of sudden cardiac death in her father (though it seems he did have other cardiac risk factors including HTN and HLD).  Patient would like for me to place an order for this, so it is available to her should she decide to proceed.  I think this is reasonable. - ECHOCARDIOGRAM COMPLETE; Future  3. Elevated LFTs Probably related to previous ashwagandha use, will recheck today - Comprehensive metabolic panel - TSH - Lipid panel  4. Thrombocytosis Repeat CBC - CBC with Differential/Platelet - TSH  5. Screening for hyperlipidemia Patient would like to check lipids. - Lipid panel   Return in about 3 months (around 01/11/2024) for CPE with pap.    Alvester Morin, PA-C, DMSc, Nutritionist [facility] [phone]

## 2023-10-14 NOTE — Patient Instructions (Signed)

## 2023-10-15 LAB — CBC WITH DIFFERENTIAL/PLATELET
Basophils Absolute: 0.1 10*3/uL (ref 0.0–0.2)
Basos: 1 %
EOS (ABSOLUTE): 0.1 10*3/uL (ref 0.0–0.4)
Eos: 1 %
Hematocrit: 40.9 % (ref 34.0–46.6)
Hemoglobin: 13.8 g/dL (ref 11.1–15.9)
Immature Grans (Abs): 0 10*3/uL (ref 0.0–0.1)
Immature Granulocytes: 0 %
Lymphocytes Absolute: 3.4 10*3/uL — ABNORMAL HIGH (ref 0.7–3.1)
Lymphs: 50 %
MCH: 31.7 pg (ref 26.6–33.0)
MCHC: 33.7 g/dL (ref 31.5–35.7)
MCV: 94 fL (ref 79–97)
Monocytes Absolute: 0.5 10*3/uL (ref 0.1–0.9)
Monocytes: 7 %
Neutrophils Absolute: 2.8 10*3/uL (ref 1.4–7.0)
Neutrophils: 41 %
Platelets: 367 10*3/uL (ref 150–450)
RBC: 4.36 x10E6/uL (ref 3.77–5.28)
RDW: 13.4 % (ref 11.7–15.4)
WBC: 6.9 10*3/uL (ref 3.4–10.8)

## 2023-10-15 LAB — LIPID PANEL
Chol/HDL Ratio: 1.5 {ratio} (ref 0.0–4.4)
Cholesterol, Total: 171 mg/dL (ref 100–199)
HDL: 115 mg/dL (ref >39)
LDL Chol Calc (NIH): 28 mg/dL (ref 0–99)
Triglycerides: 189 mg/dL — ABNORMAL HIGH (ref 0–149)
VLDL Cholesterol Cal: 28 mg/dL (ref 5–40)

## 2023-10-15 LAB — TSH: TSH: 2.45 u[IU]/mL (ref 0.450–4.500)

## 2023-10-15 LAB — COMPREHENSIVE METABOLIC PANEL
ALT: 45 [IU]/L — ABNORMAL HIGH (ref 0–32)
AST: 47 [IU]/L — ABNORMAL HIGH (ref 0–40)
Albumin: 4.5 g/dL (ref 4.0–5.0)
Alkaline Phosphatase: 79 [IU]/L (ref 44–121)
BUN/Creatinine Ratio: 9 (ref 9–23)
BUN: 7 mg/dL (ref 6–20)
Bilirubin Total: 0.4 mg/dL (ref 0.0–1.2)
CO2: 26 mmol/L (ref 20–29)
Calcium: 9.6 mg/dL (ref 8.7–10.2)
Chloride: 99 mmol/L (ref 96–106)
Creatinine, Ser: 0.82 mg/dL (ref 0.57–1.00)
Globulin, Total: 2.4 g/dL (ref 1.5–4.5)
Glucose: 75 mg/dL (ref 70–99)
Potassium: 3.4 mmol/L — ABNORMAL LOW (ref 3.5–5.2)
Sodium: 142 mmol/L (ref 134–144)
Total Protein: 6.9 g/dL (ref 6.0–8.5)
eGFR: 99 mL/min/{1.73_m2} (ref >59)

## 2023-10-20 ENCOUNTER — Telehealth: Payer: BC Managed Care – PPO | Admitting: Physician Assistant

## 2023-10-20 DIAGNOSIS — N760 Acute vaginitis: Secondary | ICD-10-CM

## 2023-10-20 DIAGNOSIS — B9689 Other specified bacterial agents as the cause of diseases classified elsewhere: Secondary | ICD-10-CM | POA: Diagnosis not present

## 2023-10-20 MED ORDER — METRONIDAZOLE 500 MG PO TABS: 500.0000 mg | ORAL_TABLET | Freq: Two times a day (BID) | ORAL | 0 refills | Status: DC

## 2023-10-20 NOTE — Progress Notes (Signed)

## 2023-10-20 NOTE — Progress Notes (Signed)
 I have spent 5 minutes in review of e-visit questionnaire, review and updating patient chart, medical decision making and response to patient.   Piedad Climes, PA-C

## 2023-10-28 ENCOUNTER — Telehealth: Payer: BC Managed Care – PPO | Admitting: Physician Assistant

## 2023-10-28 DIAGNOSIS — A084 Viral intestinal infection, unspecified: Secondary | ICD-10-CM

## 2023-10-28 MED ORDER — ONDANSETRON 4 MG PO TBDP: 4.0000 mg | ORAL_TABLET | Freq: Three times a day (TID) | ORAL | 0 refills | Status: DC | PRN

## 2023-10-28 NOTE — Progress Notes (Signed)
 I have spent 5 minutes in review of e-visit questionnaire, review and updating patient chart, medical decision making and response to patient.   Piedad Climes, PA-C

## 2023-10-28 NOTE — Progress Notes (Signed)

## 2023-11-16 ENCOUNTER — Ambulatory Visit: Payer: BC Managed Care – PPO

## 2023-11-17 ENCOUNTER — Ambulatory Visit: Payer: BC Managed Care – PPO | Admitting: Physician Assistant

## 2023-11-18 ENCOUNTER — Ambulatory Visit
Admission: RE | Admit: 2023-11-18 | Discharge: 2023-11-18 | Disposition: A | Discharge status: Home / Self Care | Payer: BC Managed Care – PPO | Source: Ambulatory Visit | Attending: Physician Assistant | Admitting: Physician Assistant

## 2023-11-18 DIAGNOSIS — R011 Cardiac murmur, unspecified: Secondary | ICD-10-CM | POA: Insufficient documentation

## 2023-11-18 DIAGNOSIS — F419 Anxiety disorder, unspecified: Secondary | ICD-10-CM | POA: Insufficient documentation

## 2023-11-18 LAB — ECHOCARDIOGRAM COMPLETE
AR max vel: 2.76 cm2
AV Area VTI: 2.71 cm2
AV Area mean vel: 2.89 cm2
AV Mean grad: 3 mmHg
AV Peak grad: 6.3 mmHg
Ao pk vel: 1.25 m/s
Area-P 1/2: 4.89 cm2
S' Lateral: 2.1 cm

## 2023-11-18 NOTE — Progress Notes (Signed)
*  PRELIMINARY RESULTS* Echocardiogram 2D Echocardiogram has been performed.  Cristela Blue 11/18/2023, 9:50 AM

## 2023-11-18 NOTE — Progress Notes (Signed)
*  PRELIMINARY RESULTS* Echocardiogram 2D Echocardiogram has been performed.  Charlene Reed 11/18/2023, 9:51 AM

## 2023-11-18 NOTE — Progress Notes (Signed)
*  PRELIMINARY RESULTS* Echocardiogram 2D Echocardiogram has been performed.  Cristela Blue 11/18/2023, 9:51 AM

## 2024-01-11 ENCOUNTER — Ambulatory Visit: Admitting: Family Medicine

## 2024-01-11 ENCOUNTER — Encounter: Payer: Self-pay | Admitting: Family Medicine

## 2024-01-11 VITALS — BP 107/58 | HR 70 | Temp 97.9°F | Ht 60.0 in | Wt 136.0 lb

## 2024-01-11 DIAGNOSIS — R062 Wheezing: Secondary | ICD-10-CM | POA: Diagnosis not present

## 2024-01-11 MED ORDER — ALBUTEROL SULFATE HFA 108 (90 BASE) MCG/ACT IN AERS: 2.0000 | INHALATION_SPRAY | Freq: Four times a day (QID) | RESPIRATORY_TRACT | 3 refills | Status: DC | PRN

## 2024-01-11 MED ORDER — PREDNISONE 20 MG PO TABS: 40.0000 mg | ORAL_TABLET | Freq: Every day | ORAL | 0 refills | Status: AC

## 2024-01-11 MED ORDER — IPRATROPIUM-ALBUTEROL 0.5-2.5 (3) MG/3ML IN SOLN
3.0000 mL | Freq: Once | RESPIRATORY_TRACT | Status: AC
  Administered 2024-01-11: 3 mL via RESPIRATORY_TRACT

## 2024-01-11 NOTE — Progress Notes (Signed)
 Acute Office Visit  Subjective:     Patient ID: Charlene Reed, female    DOB: April 20, 1996, 28 y.o.   MRN: [id number]  Chief Complaint  Patient presents with   Shortness of Breath    HPI Patient is in today for wheezing, dyspnea, cough.   Discussed the use of AI scribe software for clinical note transcription with the patient, who gave verbal consent to proceed.  History of Present Illness Charlene Reed is a 28 year old female who presents with difficulty breathing, wheezing, and coughing.  She has been experiencing difficulty breathing, wheezing, and coughing primarily at night for the past week. The symptoms began last weekend and are severe enough to disrupt her sleep, leaving her feeling exhausted. The wheezing is more pronounced at night.  She has been taking Allegra daily since the pollen season started, but her symptoms have not improved. In January, she experienced a similar episode after exposure to pet dander, which required urgent care treatment with DuoNebs and corticosteroids. She was prescribed albuterol  as needed, which she has been using about once a week, but she has recently run out.  Her symptoms are somewhat random and not limited to springtime, although they are exacerbated by exercise. No fever or blood in sputum, but she mentions a little drainage when coughing and an itchy throat, which she attributes to allergies.  She is currently not working and has recently finished school. 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 She is also planning a trip to [place] next week and wants to manage her symptoms before traveling. She recently bought a house with her boyfriend in [place] and is in the process of moving there.         ROS All review of systems negative except what is listed in the HPI      Objective:    BP (!) 107/58   Pulse 70   Temp 97.9 F (36.6 C) (Oral)   Ht 5' (1.524 m)   Wt 136 lb (61.7 kg)   SpO2  100%   BMI 26.56 kg/m    Physical Exam Vitals reviewed.  Constitutional:      Appearance: Normal appearance.  Cardiovascular:     Rate and Rhythm: Normal rate and regular rhythm.     Comments: Faint murmur Pulmonary:     Effort: Pulmonary effort is normal.     Breath sounds: Wheezing present. No rales.  Skin:    General: Skin is warm and dry.  Neurological:     Mental Status: She is alert and oriented to person, place, and time.  Psychiatric:        Mood and Affect: Mood normal.        Behavior: Behavior normal.        Thought Content: Thought content normal.        Judgment: Judgment normal.        No results found for any visits on 01/11/24.      Assessment & Plan:   Problem List Items Addressed This Visit   None Visit Diagnoses       Wheezing    -  Primary   Relevant Medications   albuterol  (VENTOLIN  HFA) 108 (90 Base) MCG/ACT inhaler   predniSONE  (DELTASONE ) 20 MG tablet   ipratropium-albuterol  (DUONEB) 0.5-2.5 (3) MG/3ML nebulizer solution 3 mL (Completed)   Other Relevant Orders   Ambulatory referral to Allergy       Assessment &  Plan  Never formally diagnosed with asthma, but suspicious due to current symptom and history. Acute exacerbation likely due to allergies. - Administer in-office breathing treatment. - Prescribe albuterol  inhaler with refills for PRN use. Wheezing improved after DuoNeb. - Prescribe prednisone  burst for exacerbation management. - Refer to allergy and asthma clinic for further evaluation and testing.  Allergic rhinitis Contributing to asthma exacerbation, managed with Allegra. - Continue Allegra daily. - Refer to allergy and asthma clinic for allergy testing and asthma evaluation.    Meds ordered this encounter  Medications   albuterol  (VENTOLIN  HFA) 108 (90 Base) MCG/ACT inhaler    Sig: Inhale 2 puffs into the lungs every 6 (six) hours as needed for wheezing or shortness of breath.    Dispense:  8 g    Refill:  3     Supervising Provider:   Randie Bustle A [[id number]]   predniSONE  (DELTASONE ) 20 MG tablet    Sig: Take 2 tablets (40 mg total) by mouth daily with breakfast for 5 days.    Dispense:  10 tablet    Refill:  0    Supervising Provider:   Randie Bustle A [[id number]]   ipratropium-albuterol  (DUONEB) 0.5-2.5 (3) MG/3ML nebulizer solution 3 mL    Return if symptoms worsen or fail to improve.  Everlina Hock, NP

## 2024-02-29 NOTE — Progress Notes (Unsigned)
 New Patient Note  RE: Charlene Reed MRN: [id number] DOB: 1996-01-21 Date of Office Visit: 03/01/2024  Consult requested by: Manya Toribio SQUIBB, PA Primary care provider: Manya Toribio SQUIBB, PA  Chief Complaint: No chief complaint on file.  History of Present Illness: I had the pleasure of seeing Charlene Reed for initial evaluation at the [facility] on 03/01/2024. She is a 28 y.o. female, who is referred here by Manya Toribio SQUIBB, PA for the evaluation of wheezing.  Discussed the use of AI scribe software for clinical note transcription with the patient, who gave verbal consent to proceed.  History of Present Illness             She reports symptoms of *** chest tightness, shortness of breath, coughing, wheezing, nocturnal awakenings for *** years. Current medications include *** which help. She reports *** using aerochamber with inhalers. She tried the following inhalers: ***. Main triggers are ***allergies, infections, weather changes, smoke, exercise, pet exposure. In the last month, frequency of symptoms: ***x/week. Frequency of nocturnal symptoms: ***x/month. Frequency of SABA use: ***x/week. Interference with physical activity: ***. Sleep is ***disturbed. In the last 12 months, emergency room visits/urgent care visits/doctor office visits or hospitalizations due to respiratory issues: ***. In the last 12 months, oral steroids courses: ***. Lifetime history of hospitalization for respiratory issues: ***. Prior intubations: ***. Asthma was diagnosed at age *** by ***. History of pneumonia: ***. She was evaluated by allergist ***pulmonologist in the past. Smoking exposure: ***. Up to date with flu vaccine: ***. Up to date with pneumonia vaccine: ***. Up to date with COVID-19 vaccine: ***. Prior Covid-19 infection: ***. History of reflux: ***.  Assessment and Plan: Charlene Reed is a 28 y.o. female with: ***  Assessment and Plan               No follow-ups on  file.  No orders of the defined types were placed in this encounter.  Lab Orders  No laboratory test(s) ordered today    Other allergy screening: Asthma: {Blank single:19197::yes,no} Rhino conjunctivitis: {Blank single:19197::yes,no} Food allergy: {Blank single:19197::yes,no} Medication allergy: {Blank single:19197::yes,no} Hymenoptera allergy: {Blank single:19197::yes,no} Urticaria: {Blank single:19197::yes,no} Eczema:{Blank single:19197::yes,no} History of recurrent infections suggestive of immunodeficency: {Blank single:19197::yes,no}  Diagnostics: Spirometry:  Tracings reviewed. Her effort: {Blank single:19197::Good reproducible efforts.,It was hard to get consistent efforts and there is a question as to whether this reflects a maximal maneuver.,Poor effort, data can not be interpreted.} FVC: ***L FEV1: ***L, ***% predicted FEV1/FVC ratio: ***% Interpretation: {Blank single:19197::Spirometry consistent with mild obstructive disease,Spirometry consistent with moderate obstructive disease,Spirometry consistent with severe obstructive disease,Spirometry consistent with possible restrictive disease,Spirometry consistent with mixed obstructive and restrictive disease,Spirometry uninterpretable due to technique,Spirometry consistent with normal pattern,No overt abnormalities noted given today's efforts}.  Please see scanned spirometry results for details.  Skin Testing: {Blank single:19197::Select foods,Environmental allergy panel,Environmental allergy panel and select foods,Food allergy panel,None,Deferred due to recent antihistamines use}. *** Results discussed with patient/family.   Past Medical History: Patient Active Problem List   Diagnosis Date Noted  . Generalized anxiety disorder 10/14/2023  . Adjustment disorder with mixed anxiety and depressed mood 07/02/2023  . Elevated LFTs 05/26/2023  . Systolic murmur  90/75/7975  . Anxiety 05/26/2023  . Performance anxiety 05/26/2023  . Seasonal allergic rhinitis 06/06/2022  . Acne vulgaris 06/11/2018  . Family history of sudden cardiac death in father 10/25/2012   Past Medical History:  Diagnosis Date  . Anxiety   . Heart murmur  Past Surgical History: No past surgical history on file. Medication List:  Current Outpatient Medications  Medication Sig Dispense Refill  . albuterol  (VENTOLIN  HFA) 108 (90 Base) MCG/ACT inhaler Inhale 2 puffs into the lungs every 6 (six) hours as needed for wheezing or shortness of breath. 8 g 3  . fexofenadine (ALLEGRA) 180 MG tablet Take 180 mg by mouth daily.    . sertraline  (ZOLOFT ) 100 MG tablet Take 1 tablet (100 mg total) by mouth at bedtime. 90 tablet 1  . tretinoin (RETIN-A) 0.025 % cream Apply 1 Application topically at bedtime.     No current facility-administered medications for this visit.   Allergies: No Known Allergies Social History: Social History   Socioeconomic History  . Marital status: Single    Spouse name: Not on file  . Number of children: 0  . Years of education: Not on file  . Highest education level: Bachelor's degree (e.g., BA, AB, BS)  Occupational History  . Not on file  Tobacco Use  . Smoking status: Never  . Smokeless tobacco: Never  Vaping Use  . Vaping status: Never Used  Substance and Sexual Activity  . Alcohol use: Not Currently    Alcohol/week: 6.0 standard drinks of alcohol    Types: 6 Shots of liquor per week  . Drug use: Never  . Sexual activity: Yes    Partners: Male    Birth control/protection: Condom  Other Topics Concern  . Not on file  Social History Narrative  . Not on file   Social Drivers of Health   Financial Resource Strain: Low Risk  (10/06/2023)   Overall Financial Resource Strain (CARDIA)   . Difficulty of Paying Living Expenses: Not hard at all  Food Insecurity: No Food Insecurity (10/06/2023)   Hunger Vital Sign   . Worried About Community education officer in the Last Year: Never true   . Ran Out of Food in the Last Year: Never true  Transportation Needs: No Transportation Needs (10/06/2023)   PRAPARE - Transportation   . Lack of Transportation (Medical): No   . Lack of Transportation (Non-Medical): No  Physical Activity: Sufficiently Active (10/06/2023)   Exercise Vital Sign   . Days of Exercise per Week: 3 days   . Minutes of Exercise per Session: 60 min  Stress: Stress Concern Present (10/06/2023)   [employer] of Occupational Health - Occupational Stress Questionnaire   . Feeling of Stress : To some extent  Social Connections: Moderately Integrated (10/06/2023)   Social Connection and Isolation Panel   . Frequency of Communication with Friends and Family: Three times a week   . Frequency of Social Gatherings with Friends and Family: More than three times a week   . Attends Religious Services: Never   . Active Member of Clubs or Organizations: Yes   . Attends Banker Meetings: 1 to 4 times per year   . Marital Status: Living with partner   Lives in a ***. Smoking: *** Occupation: ***  Environmental HistorySurveyor, minerals in the house: Network engineer in the family room: {Blank single:19197::yes,no} Carpet in the bedroom: {Blank single:19197::yes,no} Heating: {Blank single:19197::electric,gas,heat pump} Cooling: {Blank single:19197::central,window,heat pump} Pet: {Blank single:19197::yes ***,no}  Family History: Family History  Problem Relation Age of Onset  . Varicose Veins Mother   . Heart disease Father   . Sudden Cardiac Death Father   . High blood pressure Father   . Hyperlipidemia Father   . Hypertension Father   . Breast  cancer Maternal Grandmother    Problem                               Relation Asthma                                   *** Eczema                                *** Food allergy                          *** Allergic  rhino conjunctivitis     ***  Review of Systems  Constitutional:  Negative for appetite change, chills, fever and unexpected weight change.  HENT:  Negative for congestion and rhinorrhea.   Eyes:  Negative for itching.  Respiratory:  Negative for cough, chest tightness, shortness of breath and wheezing.   Cardiovascular:  Negative for chest pain.  Gastrointestinal:  Negative for abdominal pain.  Genitourinary:  Negative for difficulty urinating.  Skin:  Negative for rash.  Neurological:  Negative for headaches.   Objective: There were no vitals taken for this visit. There is no height or weight on file to calculate BMI. Physical Exam Vitals and nursing note reviewed.  Constitutional:      Appearance: Normal appearance. She is well-developed.  HENT:     Head: Normocephalic and atraumatic.     Right Ear: Tympanic membrane and external ear normal.     Left Ear: Tympanic membrane and external ear normal.     Nose: Nose normal.     Mouth/Throat:     Mouth: Mucous membranes are moist.     Pharynx: Oropharynx is clear.   Eyes:     Conjunctiva/sclera: Conjunctivae normal.    Cardiovascular:     Rate and Rhythm: Normal rate and regular rhythm.     Heart sounds: Normal heart sounds. No murmur heard.    No friction rub. No gallop.  Pulmonary:     Effort: Pulmonary effort is normal.     Breath sounds: Normal breath sounds. No wheezing, rhonchi or rales.   Musculoskeletal:     Cervical back: Neck supple.   Skin:    General: Skin is warm.     Findings: No rash.   Neurological:     Mental Status: She is alert and oriented to person, place, and time.   Psychiatric:        Behavior: Behavior normal.  The plan was reviewed with the patient/family, and all questions/concerned were addressed.  It was my pleasure to see Laiza today and participate in her care. Please feel free to contact me with any questions or concerns.  Sincerely,  Orlan Cramp, DO Allergy &  Immunology  [facility]  [place] office: [phone] [facility] office: [phone]

## 2024-03-01 ENCOUNTER — Other Ambulatory Visit: Payer: Self-pay

## 2024-03-01 ENCOUNTER — Ambulatory Visit: Admitting: Allergy

## 2024-03-01 ENCOUNTER — Encounter: Payer: Self-pay | Admitting: Allergy

## 2024-03-01 VITALS — BP 110/78 | HR 72 | Temp 98.3°F | Resp 18 | Ht 60.25 in | Wt 135.8 lb

## 2024-03-01 DIAGNOSIS — J3089 Other allergic rhinitis: Secondary | ICD-10-CM

## 2024-03-01 DIAGNOSIS — J4530 Mild persistent asthma, uncomplicated: Secondary | ICD-10-CM | POA: Diagnosis not present

## 2024-03-01 MED ORDER — LEVALBUTEROL TARTRATE 45 MCG/ACT IN AERO
4.0000 | INHALATION_SPRAY | Freq: Once | RESPIRATORY_TRACT | Status: AC
  Administered 2024-03-01: 4 via RESPIRATORY_TRACT

## 2024-03-01 MED ORDER — AIRSUPRA 90-80 MCG/ACT IN AERO: 2.0000 | INHALATION_SPRAY | RESPIRATORY_TRACT | 2 refills | Status: AC | PRN

## 2024-03-01 MED ORDER — FLUTICASONE FUROATE-VILANTEROL 100-25 MCG/ACT IN AEPB: 1.0000 | INHALATION_SPRAY | Freq: Every day | RESPIRATORY_TRACT | 3 refills | Status: DC

## 2024-03-01 NOTE — Addendum Note (Signed)
 Addended by: MARINDA JANSKY on: 03/01/2024 10:23 AM   Modules accepted: Orders

## 2024-03-01 NOTE — Patient Instructions (Addendum)
 Breathing Daily controller medication(s): start Breo 100mcg 1 puff once a day and rinse mouth after each use. Demonstrated proper use.  If not covered, let me know beforehand.  May use Airsupra rescue inhaler 2 puffs every 4 to 6 hours as needed for shortness of breath, chest tightness, coughing, and wheezing. Do not use more than 12 puffs in 24 hours. May use Airsupra rescue inhaler 2 puffs 5 to 15 minutes prior to strenuous physical activities. Rinse mouth after each use.  Coupon given.  Monitor frequency of use - if you need to use it more than twice per week on a consistent basis let us  know.  Breathing control goals:  Full participation in all desired activities (may need albuterol  before activity) Albuterol  use two times or less a week on average (not counting use with activity) Cough interfering with sleep two times or less a month Oral steroids no more than once a year No hospitalizations   Rhinitis  Return for allergy skin testing. Will make additional recommendations based on results. Make sure you don't take any antihistamines for 3 days before the skin testing appointment. Don't put any lotion on the back and arms on the day of testing.  Must be in good health and not ill. No vaccines/injections/antibiotics within the past 7 days.  Plan on being here for 30-60 minutes. Use over the counter antihistamines such as Zyrtec (cetirizine), Claritin (loratadine), Allegra (fexofenadine), or Xyzal (levocetirizine) daily as needed. May take twice a day during allergy flares. May switch antihistamines every few months. Hold 3 days before skin testing.  Follow up for skin testing in 1 month in [place] office.

## 2024-03-21 ENCOUNTER — Telehealth: Admitting: Physician Assistant

## 2024-03-21 DIAGNOSIS — R4586 Emotional lability: Secondary | ICD-10-CM

## 2024-03-21 NOTE — Patient Instructions (Signed)
 Charlene Reed, thank you for joining Delon [name], PA-C for today's virtual visit.  While this provider is not your primary care provider (PCP), if your PCP is located in our provider database this encounter information will be shared with them immediately following your visit.   A [facility] MyChart account gives you access to today's visit and all your visits, tests, and labs performed at [facility]  click here if you don't have a [facility] MyChart account or go to [email or web address]  Consent: (Patient) Charlene Reed provided verbal consent for this virtual visit at the beginning of the encounter.  Current Medications:  Current Outpatient Medications:    Albuterol -Budesonide (AIRSUPRA ) 90-80 MCG/ACT AERO, Inhale 2 puffs into the lungs every 4 (four) hours as needed (coughing, wheezing, chest tightness). Do not exceed 12 puffs in 24 hours., Disp: 10.7 g, Rfl: 2   fexofenadine (ALLEGRA) 180 MG tablet, Take 180 mg by mouth daily., Disp: , Rfl:    fluticasone  furoate-vilanterol (BREO ELLIPTA ) 100-25 MCG/ACT AEPB, Inhale 1 puff into the lungs daily. Rinse mouth after each use., Disp: 60 each, Rfl: 3   sertraline  (ZOLOFT ) 100 MG tablet, Take 1 tablet (100 mg total) by mouth at bedtime., Disp: 90 tablet, Rfl: 1   tretinoin (RETIN-A) 0.025 % cream, Apply 1 Application topically at bedtime., Disp: , Rfl:    Medications ordered in this encounter:  No orders of the defined types were placed in this encounter.    *If you need refills on other medications prior to your next appointment, please contact your pharmacy*  Follow-Up: Call back or seek an in-person evaluation if the symptoms worsen or if the condition fails to improve as anticipated.  [facility] [phone]  Other Instructions Mindfulness Training/Deep Breathing/Guided Meditations: - Consider downloading one of the following apps as they each deal with walking you through these  techniques -- Calm, HeadSpace, [place] - There are also a lot of great books on these subjects. Here are a few good ones to consider:           * Mindfulness for Beginners by Thom Needs           * Practicing Mindfulness by Donnice Morones           * Breath WORK by Gene Smithson   National Suicide Prevention Lifeline 780-607-4447 340-324-2461) OffParking.fi "988"-Mental Health Crisis [facility] (332) 379-4138 nimhinfo@nih .gov (e-mail) http://www.maynard.net/  Local Resources: Outpatient:   [facility]:  [phone] [address]   [place], [place]  [address]   https://thomas-smith.info/  [facility] [place] [address] [place], [place] [address] [phone]  [facility]  Outpatient Behavioral [facility] at [facility] [address] [employer]. [address] [place], [place]  [address] [phone] Local Resources (Inpatient)  [facility] [address]  [place], [place] [address] [phone]  [facility] Medicine Unit and Geriatric Psychiatric Unit   [address]  [place], [place] [address] [phone]   NAMI [facility] [email or web address]  NAMI [facility] https://[email or web address]  [employer] and Support Resources:  Partners Ending Homelessness DesireBlog.pl  Loss adjuster, chartered https://www.interactiveresourcecenter.org/  [employer] https://www.greensborourbanministry.org/  [facility] of [employer] (adult [employer] shelter) www.opendoorministrieshp.org [address] [place], [place] [address] [phone] [employer] of [employer] and Center of [facility] [phone] [place], [place] [address]  [phone]   [facility] [phone] VET [phone])  [facility] [phone] press 1 Confidential chat-VeteransCrisisLine.net Or Text to ([phone]  [facility] Call 4065202509 or 4697896778 www.http://stephens-[email or web address].[email or web address]/  Affordable Housing Resources in [place]  nchousingsearch.com   [phone]  [facility] Housing Hotline [phone] 8:30am-5:30pm Caring Services - Vet Safety Net [address] [place], [place]  [address] [phone] Female veterans 18+ with substance abuse issues Eligibility:  By Referral Only  [facility] [address] [place], [place] [address] [phone] Ext. 27 Adult Men & Women Eligibility: Valid ID & Social Engineer, drilling.greensborourbanministry.[facility]  Caring Services - Vet Safety Net [address] [place], [place]  [address] [phone] Female veterans 18+ with substance abuse issues Eligibility:  By Referral Only  [facility] [address] [place], [place]  [address] [phone] Single women 18+ without dependents Open 6pm-8am Eligibility:  Valid ID & Social Security Card Call to check availability  http://westendministries.org/leslieshouse.aspx  [facility] [address] [place], [place]  [address] [phone] Female veterans 18+ with substance abuse/mental health issues Eligibility:  By Referral Only  [facility] [address] [place], [place] [address] [phone] Call to check availability Males 18+ Eligibility: Valid ID & Social Security Card www.odm-hp.[facility] of [employer] [address] [place], [place] [address] [phone] Women 18+ & Families with children Eligibility:  Valid ID & Criminal Background Check https://gomez.info/     [facility] [place] (Care Management): [phone] The  [facility]: Behavioral Health 24 Hour Phone Line [phone] NAMI Hotline 7620763173 NAMI   [phone]  2-1-1 Referral Service [facility] 211 Call 211 or [phone] www.http://[email or web address]-[email or web address]/ A free [employer], 24/7 telephone information and referral service to help link citizens who are seeking help with the community resources they need. In [place], [place], [place] and [place] counties, just dial 211 from your phone.  Mental Health Association in [place] ([phone])328-5212 Support groups for anxiety, depression and bipolar disorder, schizophrenia, family and friends, aftermath of suicide, and mental wellness for Latinos (in [place]).  Mental Health Association in [facility] [phone] Offers support groups, [employer] program offers. Psychological, vocational, educational and other rehabilitation services to those who suffer from mental health illness of the 38 and up (5 days a week/5hours a day), offer out patient services like diagnostic evaluation, comprehensive clinical assessments, individual counseling, group therapy, psycho-educational workshops, referral to other specialists, referral to a psychiatrist for an evaluation for medication, consultation, and outreach/training.  ADS (Alcohol and Drug Services) 812-626-3168 947-020-1429 Substance Abuse education, prevention and treatment (detox, assessments, intensive outpatient and inpatient counseling and programs).  [facility] [place] [phone], [place] 9017166976 Support groups for posttraumatic stress, depression, and schizophrenia? as well as day programs for individuals with severe mental illness.  [facility] [place] [phone]  [facility]-FREE-([phone]) [phone]  [employer] [facility] (781)169-4742 or www.kellinfoundation.org Telehealth platform, Individual counseling across the lifespan for both mental health and substance use, support groups, advocacy, case management, virtual villages, resource  coordination.  [phone]  [facility]-FREE-(607)077-1220 or ([phone]. Provides mental health services to all residents regardless of ability to pay. [address] [place]. [place]. 24  Domestic Violence Crisis Line Family Service of the [employer] for shelter and/or Chief of Staff [employer] [phone] (24/7) Valentin Khat [phone] (24/7)  VA Homeless Hotline 307-022-6091  [facility] ([phone] press 1 Confidential chat-VeteransCrisisLine.net Or Text to [phone]  [facility] [address] [place], [place] [address] [phone] Hours: Mon-Fri. 8am-5pm [facility] Mobile Crisis Management Mobile crisis response for mental health, substance abuse or intellectual/developmental disabilities [phone]  Disclaimer: This resource list is subject to change at any time and is a starting point for resource identification as of 08/30/2021.     Take Care!    If you have been instructed to have an in-person evaluation today at a local Urgent Care facility, please use the link below. It will take you to a list of all of our available [facility], including address, phone number and hours of operation. Please do not delay care.  [facility]  If you or a family member do not have a primary care provider, use the link below to schedule a visit and establish care. When you choose a [facility] primary care physician or advanced practice provider, you gain a long-term partner in health. Find a Primary Care Provider  Learn more about [facility]'s in-office and virtual care options: [facility] - Get Care Now

## 2024-03-21 NOTE — Progress Notes (Signed)
 Virtual Visit Consent   Charlene Reed, you are scheduled for a virtual visit with a [facility] provider today. Just as with appointments in the office, your consent must be obtained to participate. Your consent will be active for this visit and any virtual visit you may have with one of our providers in the next 365 days. If you have a MyChart account, a copy of this consent can be sent to you electronically.  As this is a virtual visit, video technology does not allow for your provider to perform a traditional examination. This may limit your provider's ability to fully assess your condition. If your provider identifies any concerns that need to be evaluated in person or the need to arrange testing (such as labs, EKG, etc.), we will make arrangements to do so. Although advances in technology are sophisticated, we cannot ensure that it will always work on either your end or our end. If the connection with a video visit is poor, the visit may have to be switched to a telephone visit. With either a video or telephone visit, we are not always able to ensure that we have a secure connection.  By engaging in this virtual visit, you consent to the provision of healthcare and authorize for your insurance to be billed (if applicable) for the services provided during this visit. Depending on your insurance coverage, you may receive a charge related to this service.  I need to obtain your verbal consent now. Are you willing to proceed with your visit today? Charlene Reed has provided verbal consent on 03/21/2024 for a virtual visit (video or telephone). Delon [name], PA-C  Date: 03/21/2024 1:32 PM   Virtual Visit via Video Note   I, Delon [name], connected with  Charlene Reed  ([id number], 1996-04-18) on 03/21/24 at 12:15 PM EDT by a video-enabled telemedicine application and verified that I am speaking with the correct person using two identifiers.  Location: Patient: Virtual Visit Location  Patient: Home Provider: Virtual Visit Location Provider: Home Office   I discussed the limitations of evaluation and management by telemedicine and the availability of in person appointments. The patient expressed understanding and agreed to proceed.    History of Present Illness: Charlene Reed is a 28 y.o. who identifies as a female who was assigned female at birth, and is being seen today for questions about having Bipolar disorder. She has been followed by Toribio Hoyle, PA-C. He has started her on Sertraline  100mg  daily. She reports compliance but reports she is not having any improvements in symptoms and feels she is having more mood swings as well.    Problems:  Patient Active Problem List   Diagnosis Date Noted   Generalized anxiety disorder 10/14/2023   Adjustment disorder with mixed anxiety and depressed mood 07/02/2023   Elevated LFTs 05/26/2023   Systolic murmur 05/26/2023   Anxiety 05/26/2023   Performance anxiety 05/26/2023   Seasonal allergic rhinitis 06/06/2022   Acne vulgaris 06/11/2018   Family history of sudden cardiac death in father 10/26/2012    Allergies: No Known Allergies Medications:  Current Outpatient Medications:    Albuterol -Budesonide (AIRSUPRA ) 90-80 MCG/ACT AERO, Inhale 2 puffs into the lungs every 4 (four) hours as needed (coughing, wheezing, chest tightness). Do not exceed 12 puffs in 24 hours., Disp: 10.7 g, Rfl: 2   fexofenadine (ALLEGRA) 180 MG tablet, Take 180 mg by mouth daily., Disp: , Rfl:    fluticasone  furoate-vilanterol (BREO ELLIPTA ) 100-25 MCG/ACT AEPB, Inhale  1 puff into the lungs daily. Rinse mouth after each use., Disp: 60 each, Rfl: 3   sertraline  (ZOLOFT ) 100 MG tablet, Take 1 tablet (100 mg total) by mouth at bedtime., Disp: 90 tablet, Rfl: 1   tretinoin (RETIN-A) 0.025 % cream, Apply 1 Application topically at bedtime., Disp: , Rfl:   Observations/Objective: Patient is well-developed, well-nourished in no acute distress.  Resting  comfortably at home.  Head is normocephalic, atraumatic.  No labored breathing.  Speech is clear and coherent with logical content.  Patient is alert and oriented at baseline.    Assessment and Plan: 1. Mood swings (Primary)  - Advised patient since she is already being followed and managed she should follow up with her PCP for further management and medication adjustments. She could also seek behavioral health evaluations as well for a formal diagnosis   Follow Up Instructions: I discussed the assessment and treatment plan with the patient. The patient was provided an opportunity to ask questions and all were answered. The patient agreed with the plan and demonstrated an understanding of the instructions.  A copy of instructions were sent to the patient via MyChart unless otherwise noted below.    The patient was advised to call back or seek an in-person evaluation if the symptoms worsen or if the condition fails to improve as anticipated.    Delon [name], PA-C

## 2024-03-25 ENCOUNTER — Encounter: Admitting: Physician Assistant

## 2024-03-25 ENCOUNTER — Encounter: Payer: Self-pay | Admitting: Family Medicine

## 2024-03-26 ENCOUNTER — Other Ambulatory Visit: Payer: Self-pay

## 2024-03-26 ENCOUNTER — Emergency Department
Admission: EM | Admit: 2024-03-26 | Discharge: 2024-03-26 | Disposition: A | Discharge status: Home / Self Care | Attending: Emergency Medicine | Admitting: Emergency Medicine

## 2024-03-26 ENCOUNTER — Encounter: Payer: Self-pay | Admitting: Emergency Medicine

## 2024-03-26 DIAGNOSIS — R1010 Upper abdominal pain, unspecified: Secondary | ICD-10-CM

## 2024-03-26 DIAGNOSIS — R748 Abnormal levels of other serum enzymes: Secondary | ICD-10-CM | POA: Diagnosis not present

## 2024-03-26 DIAGNOSIS — R1013 Epigastric pain: Secondary | ICD-10-CM | POA: Insufficient documentation

## 2024-03-26 DIAGNOSIS — R109 Unspecified abdominal pain: Secondary | ICD-10-CM | POA: Diagnosis not present

## 2024-03-26 DIAGNOSIS — K047 Periapical abscess without sinus: Secondary | ICD-10-CM | POA: Diagnosis not present

## 2024-03-26 LAB — URINALYSIS, ROUTINE W REFLEX MICROSCOPIC
Bilirubin Urine: NEGATIVE
Glucose, UA: NEGATIVE mg/dL
Ketones, ur: 5 mg/dL — AB
Nitrite: NEGATIVE
Protein, ur: 30 mg/dL — AB
Specific Gravity, Urine: 1.019 (ref 1.005–1.030)
pH: 6 (ref 5.0–8.0)

## 2024-03-26 LAB — COMPREHENSIVE METABOLIC PANEL WITH GFR
ALT: 52 U/L — ABNORMAL HIGH (ref 0–44)
AST: 92 U/L — ABNORMAL HIGH (ref 15–41)
Albumin: 4.2 g/dL (ref 3.5–5.0)
Alkaline Phosphatase: 74 U/L (ref 38–126)
Anion gap: 17 — ABNORMAL HIGH (ref 5–15)
BUN: 12 mg/dL (ref 6–20)
CO2: 19 mmol/L — ABNORMAL LOW (ref 22–32)
Calcium: 9.2 mg/dL (ref 8.9–10.3)
Chloride: 101 mmol/L (ref 98–111)
Creatinine, Ser: 0.84 mg/dL (ref 0.44–1.00)
GFR, Estimated: >60 mL/min (ref >60)
Glucose, Bld: 118 mg/dL — ABNORMAL HIGH (ref 70–99)
Potassium: 3.2 mmol/L — ABNORMAL LOW (ref 3.5–5.1)
Sodium: 137 mmol/L (ref 135–145)
Total Bilirubin: 1 mg/dL (ref 0.0–1.2)
Total Protein: 7.9 g/dL (ref 6.5–8.1)

## 2024-03-26 LAB — CBC
HCT: 40.6 % (ref 36.0–46.0)
Hemoglobin: 13.4 g/dL (ref 12.0–15.0)
MCH: 31.2 pg (ref 26.0–34.0)
MCHC: 33 g/dL (ref 30.0–36.0)
MCV: 94.4 fL (ref 80.0–100.0)
Platelets: 194 K/uL (ref 150–400)
RBC: 4.3 MIL/uL (ref 3.87–5.11)
RDW: 12.9 % (ref 11.5–15.5)
WBC: 10.7 K/uL — ABNORMAL HIGH (ref 4.0–10.5)
nRBC: 0 % (ref 0.0–0.2)

## 2024-03-26 LAB — ETHANOL: Alcohol, Ethyl (B): <15 mg/dL (ref <15)

## 2024-03-26 LAB — POC URINE PREG, ED: Preg Test, Ur: NEGATIVE

## 2024-03-26 LAB — LIPASE, BLOOD: Lipase: 67 U/L — ABNORMAL HIGH (ref 11–51)

## 2024-03-26 NOTE — ED Provider Notes (Signed)
 [facility] Provider Note    Event Date/Time   First MD Initiated Contact with Patient 03/26/24 2129     (approximate)   History   Abdominal Pain and Anxiety   HPI  Charlene Reed is a 28 year old female presenting to the emergency department for evaluation of abdominal pain.  Patient reports that a few hours ago she looked in the mirror and thought that her skin looked yellow so she was worried that she was in liver failure.  After this, she began to develop pain in her abdomen and felt more anxious.  She does report that she frequently binge drinks alcohol, but does not drink daily.  Does have some improved symptoms at the time of my evaluation.  Reviewed PCP visit from 10/14/2023.  At that time patient was evaluated for generalized anxiety disorder on Zoloft , noted to have elevated LFTs thought to be possibly related to ashwagandha use.  AST ALT at that time 47 and 45 respectively.    Physical Exam   Triage Vital Signs: ED Triage Vitals [03/26/24 2046]  Encounter Vitals Group     BP (!) 149/71     Girls Systolic BP Percentile      Girls Diastolic BP Percentile      Boys Systolic BP Percentile      Boys Diastolic BP Percentile      Pulse Rate (!) 120     Resp 20     Temp 99.2 F (37.3 C)     Temp Source Oral     SpO2 97 %     Weight 128 lb (58.1 kg)     Height 5' (1.524 m)     Head Circumference      Peak Flow      Pain Score 6     Pain Loc      Pain Education      Exclude from Growth Chart     Most recent vital signs: Vitals:   03/26/24 2220 03/26/24 2230  BP:  113/82  Pulse: 88 78  Resp: 14 20  Temp:    SpO2: 100% 100%     General: Awake, interactive  CV:  Regular rate, good peripheral perfusion.  Resp:  Unlabored respirations Abd:  Nondistended, mild tenderness to palpation most notably in the epigastric region, remainder of abdomen without significant tenderness Neuro:  Symmetric facial movement, fluid speech Skin:  No  jaundice, scleral icterus   ED Results / Procedures / Treatments   Labs (all labs ordered are listed, but only abnormal results are displayed) Labs Reviewed  LIPASE, BLOOD - Abnormal; Notable for the following components:      Result Value   Lipase 67 (*)    All other components within normal limits  COMPREHENSIVE METABOLIC PANEL WITH GFR - Abnormal; Notable for the following components:   Potassium 3.2 (*)    CO2 19 (*)    Glucose, Bld 118 (*)    AST 92 (*)    ALT 52 (*)    Anion gap 17 (*)    All other components within normal limits  CBC - Abnormal; Notable for the following components:   WBC 10.7 (*)    All other components within normal limits  URINALYSIS, ROUTINE W REFLEX MICROSCOPIC - Abnormal; Notable for the following components:   Color, Urine YELLOW (*)    APPearance CLOUDY (*)    Hgb urine dipstick MODERATE (*)    Ketones, ur 5 (*)    Protein, ur  30 (*)    Leukocytes,Ua LARGE (*)    Bacteria, UA RARE (*)    All other components within normal limits  URINE CULTURE  ETHANOL  POC URINE PREG, ED     EKG EKG independently reviewed and interpreted by myself demonstrates:  EKG demonstrates sinus tachycardia at a rate of 111, PR 156, QRS 97, QTc 453, no acute ST changes  RADIOLOGY Imaging independently reviewed and interpreted by myself demonstrates:   Formal Radiology Read:  No results found.  PROCEDURES:  Critical Care performed: No  Procedures   MEDICATIONS ORDERED IN ED: Medications - No data to display   IMPRESSION / MDM / ASSESSMENT AND PLAN / ED COURSE  I reviewed the triage vital signs and the nursing notes.  Differential diagnosis includes, but is not limited to, anemia, electrolyte abnormality, transaminitis, no evidence of jaundice on exam, low suspicion acute intra-abdominal process given reassuring abdominal exam.  Patient's presentation is most consistent with acute presentation with potential threat to life or bodily  function.  28 year old female presenting with abdominal pain, concerns for jaundice.  Labs with mild leukocytosis at 10.7.  CMP without significant derangement.  Mild acidosis noted, likely related to dehydration.  Mild LFT elevation noted, slightly increased from prior with AST greater than ALT, suspect likely related to patient's alcohol use.  Normal T. bili at 1.0, exam and labs not consistent with jaundice.  Lipase minimally elevated, not consistent with pancreatitis.  Urine here is equivocal for infection with large leukocyte esterase, 11-20 white blood cells, though multiple squamous cells noted.  Reviewed workup with patient.  She does report that she has had some recent dysuria, but also notes that she just darted Augmentin  today for suspected dental infection.  With this, will hold off on further antibiotics, send urine culture.  She does have some upper abdominal pain on exam that I suspect may be related to gastritis in the setting of her alcohol use.  Discussed cutting back on alcohol use, initiation of PPI, follow-up with her primary care doctor for further evaluation.  Patient is comfortable with this plan.  Strict return precautions provided.  Patient discharged in stable condition.      FINAL CLINICAL IMPRESSION(S) / ED DIAGNOSES   Final diagnoses:  Acute upper abdominal pain  Elevated liver enzymes     Rx / DC Orders   ED Discharge Orders     None        Note:  This document was prepared using Dragon voice recognition software and may include unintentional dictation errors.   Levander Slate, MD 03/26/24 [phone]

## 2024-03-26 NOTE — Discharge Instructions (Addendum)
 You were seen in the ER today for evaluation of your abdominal pain.  Your labs showed that your liver enzymes were a little bit elevated, but you did not have signs of jaundice or liver failure.  I suspect this is related to your alcohol use, please avoid binge drinking.  Please also make sure you are staying hydrated.  I recommend starting a PPI such as omeprazole which you can buy over-the-counter for the next few weeks to see if this helps with your abdominal pain.  Follow with your primary care doctor for further evaluation.  Return to the ER for new or worsening symptoms.

## 2024-03-26 NOTE — ED Notes (Signed)
 Called lab spoke with Burna advised to run the ETOH was sent with basic labs. Lab states will look for it and cb

## 2024-03-26 NOTE — ED Triage Notes (Addendum)
 To ER with report of I'm in liver failure, I know it.. Patient reports symptoms started a few hours ago, pain to abdomen and all over. Increased stress and anxiety over the last 2 days. Decreased sleep for last few weeks. Recently restarted zoloft  and drinking alcohol. No alcohol today.  Patient very tearful and anxious in triage.

## 2024-03-26 NOTE — ED Notes (Signed)
 Lab called to add on ethanol level but no answer.

## 2024-03-27 ENCOUNTER — Telehealth: Admitting: Physician Assistant

## 2024-03-27 DIAGNOSIS — Z760 Encounter for issue of repeat prescription: Secondary | ICD-10-CM | POA: Diagnosis not present

## 2024-03-27 DIAGNOSIS — F411 Generalized anxiety disorder: Secondary | ICD-10-CM

## 2024-03-27 MED ORDER — SERTRALINE HCL 100 MG PO TABS: 100.0000 mg | ORAL_TABLET | Freq: Every day | ORAL | 0 refills | Status: AC

## 2024-03-27 NOTE — Progress Notes (Signed)
 Virtual Visit Consent   Charlene Reed, you are scheduled for a virtual visit with a [facility] provider today. Just as with appointments in the office, your consent must be obtained to participate. Your consent will be active for this visit and any virtual visit you may have with one of our providers in the next 365 days. If you have a MyChart account, a copy of this consent can be sent to you electronically.  As this is a virtual visit, video technology does not allow for your provider to perform a traditional examination. This may limit your provider's ability to fully assess your condition. If your provider identifies any concerns that need to be evaluated in person or the need to arrange testing (such as labs, EKG, etc.), we will make arrangements to do so. Although advances in technology are sophisticated, we cannot ensure that it will always work on either your end or our end. If the connection with a video visit is poor, the visit may have to be switched to a telephone visit. With either a video or telephone visit, we are not always able to ensure that we have a secure connection.  By engaging in this virtual visit, you consent to the provision of healthcare and authorize for your insurance to be billed (if applicable) for the services provided during this visit. Depending on your insurance coverage, you may receive a charge related to this service.  I need to obtain your verbal consent now. Are you willing to proceed with your visit today? Charlene Reed has provided verbal consent on 03/27/2024 for a virtual visit (video or telephone). [name], [place]  Date: 03/27/2024 2:41 PM   Virtual Visit via Video Note   I, [name], connected with  Charlene Reed  ([id number], Jul 08, 1996) on 03/27/24 at  2:30 PM EDT by a video-enabled telemedicine application and verified that I am speaking with the correct person using two identifiers.  Location: Patient: Virtual Visit Location  Patient: Home Provider: Virtual Visit Location Provider: Home Office   I discussed the limitations of evaluation and management by telemedicine and the availability of in person appointments. The patient expressed understanding and agreed to proceed.    History of Present Illness: Charlene Reed is a 28 y.o. who identifies as a female who was assigned female at birth, and is being seen today for request for one-time refill of her Sertraline  100 mg daily. Endorses she is on her least pill and will be out as of tomorrow. Reached out to her former PCP, but due to her dismissal (no-shows) they will not provide additional refill. Has not been able to get established with new PCP as she recently obtained Medicaid and waiting on her PCP assignment.   Of note, she was seen by our team on 7/21 with questions about bipolar disorder and asking for medication adjustments. She was referred to follow-up with PCP at that time. On questioning about this, patient notes she is doing well overall, but was just curious if she potentially had Bipolar depression and wanting to see someone about this.    HPI: HPI  Problems:  Patient Active Problem List   Diagnosis Date Noted   Generalized anxiety disorder 10/14/2023   Adjustment disorder with mixed anxiety and depressed mood 07/02/2023   Elevated LFTs 05/26/2023   Systolic murmur 05/26/2023   Anxiety 05/26/2023   Performance anxiety 05/26/2023   Seasonal allergic rhinitis 06/06/2022   Acne vulgaris 06/11/2018   Family history  of sudden cardiac death in father 2012/11/07    Allergies: No Known Allergies Medications:  Current Outpatient Medications:    Albuterol -Budesonide (AIRSUPRA ) 90-80 MCG/ACT AERO, Inhale 2 puffs into the lungs every 4 (four) hours as needed (coughing, wheezing, chest tightness). Do not exceed 12 puffs in 24 hours., Disp: 10.7 g, Rfl: 2   fexofenadine (ALLEGRA) 180 MG tablet, Take 180 mg by mouth daily., Disp: , Rfl:    fluticasone   furoate-vilanterol (BREO ELLIPTA ) 100-25 MCG/ACT AEPB, Inhale 1 puff into the lungs daily. Rinse mouth after each use., Disp: 60 each, Rfl: 3   sertraline  (ZOLOFT ) 100 MG tablet, Take 1 tablet (100 mg total) by mouth at bedtime., Disp: 90 tablet, Rfl: 0   tretinoin (RETIN-A) 0.025 % cream, Apply 1 Application topically at bedtime., Disp: , Rfl:   Observations/Objective: Patient is well-developed, well-nourished in no acute distress.  Resting comfortably at home.  Head is normocephalic, atraumatic.  No labored breathing. Speech is clear and coherent with logical content.  Patient is alert and oriented at baseline.   Assessment and Plan: 1. Encounter for medication refill (Primary)  2. Generalized anxiety disorder - sertraline  (ZOLOFT ) 100 MG tablet; Take 1 tablet (100 mg total) by mouth at bedtime.  Dispense: 90 tablet; Refill: 0  Will give one time refill of her current medication. She is aware we do not provide chronic depression management. While awaiting PCP assignment, gave her resources for [facility] for ongoing management and further assessment for bipolar.   Follow Up Instructions: I discussed the assessment and treatment plan with the patient. The patient was provided an opportunity to ask questions and all were answered. The patient agreed with the plan and demonstrated an understanding of the instructions.  A copy of instructions were sent to the patient via MyChart unless otherwise noted below.   The patient was advised to call back or seek an in-person evaluation if the symptoms worsen or if the condition fails to improve as anticipated.    [name], PA-C

## 2024-03-27 NOTE — Patient Instructions (Signed)
  Charlene Reed, thank you for joining [name], PA-C for today's virtual visit.  While this provider is not your primary care provider (PCP), if your PCP is located in our provider database this encounter information will be shared with them immediately following your visit.   A [facility] MyChart account gives you access to today's visit and all your visits, tests, and labs performed at [facility]  click here if you don't have a [facility] MyChart account or go to [email or web address]  Consent: (Patient) Charlene Reed provided verbal consent for this virtual visit at the beginning of the encounter.  Current Medications:  Current Outpatient Medications:    Albuterol -Budesonide (AIRSUPRA ) 90-80 MCG/ACT AERO, Inhale 2 puffs into the lungs every 4 (four) hours as needed (coughing, wheezing, chest tightness). Do not exceed 12 puffs in 24 hours., Disp: 10.7 g, Rfl: 2   fexofenadine (ALLEGRA) 180 MG tablet, Take 180 mg by mouth daily., Disp: , Rfl:    fluticasone  furoate-vilanterol (BREO ELLIPTA ) 100-25 MCG/ACT AEPB, Inhale 1 puff into the lungs daily. Rinse mouth after each use., Disp: 60 each, Rfl: 3   sertraline  (ZOLOFT ) 100 MG tablet, Take 1 tablet (100 mg total) by mouth at bedtime., Disp: 90 tablet, Rfl: 0   tretinoin (RETIN-A) 0.025 % cream, Apply 1 Application topically at bedtime., Disp: , Rfl:    Medications ordered in this encounter:  Meds ordered this encounter  Medications   sertraline  (ZOLOFT ) 100 MG tablet    Sig: Take 1 tablet (100 mg total) by mouth at bedtime.    Dispense:  90 tablet    Refill:  0    Supervising Provider:   BLAISE ALEENE KIDD [[id number]]     *If you need refills on other medications prior to your next appointment, please contact your pharmacy*  Follow-Up: Call back or seek an in-person evaluation if the symptoms worsen or if the condition fails to improve as anticipated.  [facility] [phone]  Other  Instructions A one-time refill has been sent.   Please follow-up with the [facility] [place] [address] [place] [phone] for ongoing management of anxiety and depression.    If you have been instructed to have an in-person evaluation today at a local Urgent Care facility, please use the link below. It will take you to a list of all of our available [facility], including address, phone number and hours of operation. Please do not delay care.  [facility]  If you or a family member do not have a primary care provider, use the link below to schedule a visit and establish care. When you choose a [facility] primary care physician or advanced practice provider, you gain a long-term partner in health. Find a Primary Care Provider  Learn more about [facility]'s in-office and virtual care options: [facility] - Get Care Now

## 2024-03-27 NOTE — Progress Notes (Unsigned)
 Skin testing note  RE: ROSARIO DUEY MRN: [id number] DOB: 1995/09/24 Date of Office Visit: 03/28/2024  Referring provider: Manya Toribio SQUIBB, PA Primary care provider: Pcp, No  Chief Complaint: skin testing  History of Present Illness: I had the pleasure of seeing Betti Mater for a skin testing visit at [facility] on 03/28/2024. She is a 28 y.o. female, who is being followed for asthma and allergic rhinitis. Her previous allergy  office visit was on 03/01/2024 with Dr. Luke. Today is a skin testing visit.  She is accompanied today by her fiancee who provided/contributed to the history.   Discussed the use of AI scribe software for clinical note transcription with the patient, who gave verbal consent to proceed.    She manages her symptoms with over-the-counter allergy  medications and occasionally uses over-the-counter eye drops for eye irritation, which sometimes help. She has used Afrin nasal spray twice. Her daily inhaler (Breo) has been effective in managing her breathing issues, and she has not needed to use her rescue inhaler.  She is currently trying to establish care with a primary care physician closer to her home. She is considering allergy  shots.     Assessment and Plan: Shareeka is a 28 y.o. female with: Other allergic rhinitis Allergic conjunctivitis Past history - Symptoms managed with Allegra, but not effective for wheezing.  Today's skin testing positive to grass, weed pollen, trees, dust mites, cat, dog.  Start environmental control measures as below. Use over the counter antihistamines such as Zyrtec (cetirizine), Claritin (loratadine), Allegra (fexofenadine), or Xyzal (levocetirizine) daily as needed. May take twice a day during allergy  flares. May switch antihistamines every few months. Use Flonase  (fluticasone ) nasal spray 1-2 sprays per nostril once a day as needed for nasal congestion.  Nasal saline spray (i.e., Simply Saline) or nasal saline  lavage (i.e., NeilMed) is recommended as needed and prior to medicated nasal sprays. Use olopatadine  eye drops 0.2% once a day as needed for itchy/watery eyes. Recommend allergy  injections. 2 shots. Sometimes I have PCP give injections.  Had a detailed discussion with patient/family that clinical history is suggestive of allergic rhinitis, and may benefit from allergy  immunotherapy (AIT). Discussed in detail regarding the dosing, schedule, side effects (mild to moderate local allergic reaction and rarely systemic allergic reactions including anaphylaxis), and benefits (significant improvement in nasal symptoms, seasonal flares of asthma) of immunotherapy with the patient. There is significant time commitment involved with allergy  shots, which includes weekly immunotherapy injections for first 9-12 months and then biweekly to monthly injections for 3-5 years.   Mild persistent asthma without complication - newly diagnosed. Past history - Daily symptoms and using albuterol  prn with good benefit. 3 courses of prednisone  within the past 12 months. 2025 spirometry showed no overt abnormalities noted given today's efforts with 9% and 230cc improvement in FEV1 post bronchodilator treatment. Clinically feeling improved.  Daily controller medication(s): continue Breo 100mcg 1 puff once a day and rinse mouth after each use.  May use Airsupra  rescue inhaler 2 puffs every 4 to 6 hours as needed for shortness of breath, chest tightness, coughing, and wheezing. Do not use more than 12 puffs in 24 hours. May use Airsupra  rescue inhaler 2 puffs 5 to 15 minutes prior to strenuous physical activities. Rinse mouth after each use.  Monitor frequency of use - if you need to use it more than twice per week on a consistent basis let us  know.   Return in about 3 months (around  06/28/2024).  Meds ordered this encounter  Medications   fluticasone  (FLONASE ) 50 MCG/ACT nasal spray    Sig: Place 1-2 sprays into both nostrils  daily as needed (nasal congestion).    Dispense:  16 g    Refill:  5   Olopatadine  HCl 0.2 % SOLN    Sig: Apply 1 drop to eye daily as needed (itchy/watery eyes).    Dispense:  2.5 mL    Refill:  5   Lab Orders  No laboratory test(s) ordered today    Diagnostics: Skin Testing: Environmental allergy  panel. Today's skin testing positive to grass, weed pollen, trees, dust mites, cat, dog.  Results discussed with patient/family.  Airborne Adult Perc - 03/28/24 0853     Time Antigen Placed 9146    Allergen Manufacturer Jestine    Location Back    Number of Test 55    1. Control-Buffer 50% Glycerol Negative    2. Control-Histamine 2+    3. Bahia Negative    4. French Southern Territories Negative    5. Johnson Negative    6. Kentucky  Blue Negative    7. Meadow Fescue 2+    8. Perennial Rye 4+    9. Timothy Negative    10. Ragweed Mix Negative    11. Cocklebur Negative    12. Plantain,  English Negative    13. Baccharis Negative    14. Dog Fennel Negative    15. Russian Thistle Negative    16. Lamb's Quarters Negative    17. Sheep Sorrell Negative    18. Rough Pigweed Negative    19. Marsh Elder, Rough Negative    20. Mugwort, Common Negative    21. Box, Elder Negative    22. Cedar, red Negative    23. Sweet Gum Negative    24. Pecan Pollen Negative    25. Pine Mix Negative    26. Walnut, Black Pollen Negative    27. Red Mulberry Negative    28. Ash Mix Negative    29. Birch Mix Negative    30. Beech American Negative    31. Cottonwood, Guinea-Bissau Negative    32. Hickory, White Negative    33. Maple Mix Negative    34. Oak, Guinea-Bissau Mix Negative    35. Sycamore Eastern Negative    36. Alternaria Alternata Negative    37. Cladosporium Herbarum Negative    38. Aspergillus Mix Negative    39. Penicillium Mix Negative    40. Bipolaris Sorokiniana (Helminthosporium) Negative    41. Drechslera Spicifera (Curvularia) Negative    42. Mucor Plumbeus Negative    43. Fusarium Moniliforme Negative     44. Aureobasidium Pullulans (pullulara) Negative    45. Rhizopus Oryzae Negative    46. Botrytis Cinera Negative    47. Epicoccum Nigrum Negative    48. Phoma Betae Negative    49. Dust Mite Mix 4+    50. Cat Hair 10,000 BAU/ml Negative    51.  Dog Epithelia Negative    52. Mixed Feathers Negative    53. Horse Epithelia Negative    54. Cockroach, German Negative    55. Tobacco Leaf Negative          Intradermal - 03/28/24 1003     Time Antigen Placed 1003    Allergen Manufacturer Greer    Location Arm    Number of Test 14    Control Negative    Bahia 2+    French Southern Territories Negative    Johnson Negative  Ragweed Mix Negative    Weed Mix 3+    Tree Mix 2+    Mold 1 Negative    Mold 2 Negative    Mold 3 Negative    Mold 4 Negative    Cat 4+    Dog 4+    Cockroach Negative          Previous notes and tests were reviewed. The plan was reviewed with the patient/family, and all questions/concerned were addressed.  It was my pleasure to see Meghann today and participate in her care. Please feel free to contact me with any questions or concerns.  Sincerely,  Orlan Cramp, DO Allergy  & Immunology  [facility]  [place] office: [phone] [facility] office: [phone]

## 2024-03-28 ENCOUNTER — Ambulatory Visit (HOSPITAL_COMMUNITY): Admission: EM | Admit: 2024-03-28 | Discharge: 2024-03-28 | Disposition: A | Discharge status: Home / Self Care

## 2024-03-28 ENCOUNTER — Ambulatory Visit (INDEPENDENT_AMBULATORY_CARE_PROVIDER_SITE_OTHER): Admitting: Allergy

## 2024-03-28 ENCOUNTER — Encounter: Payer: Self-pay | Admitting: Allergy

## 2024-03-28 DIAGNOSIS — J3089 Other allergic rhinitis: Secondary | ICD-10-CM

## 2024-03-28 DIAGNOSIS — F411 Generalized anxiety disorder: Secondary | ICD-10-CM

## 2024-03-28 DIAGNOSIS — F1090 Alcohol use, unspecified, uncomplicated: Secondary | ICD-10-CM

## 2024-03-28 DIAGNOSIS — F339 Major depressive disorder, recurrent, unspecified: Secondary | ICD-10-CM

## 2024-03-28 DIAGNOSIS — H1013 Acute atopic conjunctivitis, bilateral: Secondary | ICD-10-CM

## 2024-03-28 DIAGNOSIS — J4530 Mild persistent asthma, uncomplicated: Secondary | ICD-10-CM

## 2024-03-28 MED ORDER — FLUTICASONE PROPIONATE 50 MCG/ACT NA SUSP: 1.0000 | Freq: Every day | NASAL | 5 refills | Status: AC | PRN

## 2024-03-28 MED ORDER — OLOPATADINE HCL 0.2 % OP SOLN: 1.0000 [drp] | Freq: Every day | OPHTHALMIC | 5 refills | Status: AC | PRN

## 2024-03-28 NOTE — Discharge Summary (Signed)
 Charlene Reed Mater to be discharged Home per NP order. An After Visit Summary was printed and given to the patient by provider. Patient escorted out, and discharged home via private auto.  [name]  03/28/2024 11:27 AM

## 2024-03-28 NOTE — Progress Notes (Signed)
   03/28/24 1026  [facility] Triage Screening (Walk-ins at [facility] only)  How Did You Hear About Us ? Family/Friend  What Is the Reason for Your Visit/Call Today? PT 28Y female presents to [facility], accompanied by her fiancee. PT states that she has been diagnosed with anxiety and depression and binge drinks. PT reports she binge drinks every month for 1-2 weeks. PT states that she needs clarification on her diagnosis because she has been experiencing mood swings and suspects being bipolar. PT confirmed that she has been prescribed Zoloft  and takes it daily. PT denies SI, HI and AVH.  How Long Has This Been Causing You Problems? > than 6 months  Have You Recently Had Any Thoughts About Hurting Yourself? No  Are You Planning to Commit Suicide/Harm Yourself At This time? No  Have you Recently Had Thoughts About Hurting Someone Sherral? No  Are You Planning To Harm Someone At This Time? No  Physical Abuse Denies  Verbal Abuse Denies  Sexual Abuse Denies  Exploitation of patient/patient's resources Denies  Self-Neglect Denies  Are you currently experiencing any auditory, visual or other hallucinations? No  Have You Used Any Alcohol or Drugs in the Past 24 Hours? No  Do you have any current medical co-morbidities that require immediate attention? No  Clinician description of patient physical appearance/behavior: Talkative, fidgety  What Do You Feel Would Help You the Most Today? Alcohol or Drug Use Treatment;Treatment for Depression or other mood problem;Medication(s)  Determination of Need Routine (7 days)  Options For Referral Medication Management;Outpatient Therapy

## 2024-03-28 NOTE — Patient Instructions (Addendum)
 Today's skin testing positive to grass, weed pollen, trees, dust mites, cat, dog.   Results given.  Environmental allergies Start environmental control measures as below. Use over the counter antihistamines such as Zyrtec (cetirizine), Claritin (loratadine), Allegra (fexofenadine), or Xyzal (levocetirizine) daily as needed. May take twice a day during allergy  flares. May switch antihistamines every few months. Use Flonase  (fluticasone ) nasal spray 1-2 sprays per nostril once a day as needed for nasal congestion.  Nasal saline spray (i.e., Simply Saline) or nasal saline lavage (i.e., NeilMed) is recommended as needed and prior to medicated nasal sprays. Use olopatadine  eye drops 0.2% once a day as needed for itchy/watery eyes.  Recommend allergy  injections. 2 shots. Sometimes I have PCP give injections.  Had a detailed discussion with patient/family that clinical history is suggestive of allergic rhinitis, and may benefit from allergy  immunotherapy (AIT). Discussed in detail regarding the dosing, schedule, side effects (mild to moderate local allergic reaction and rarely systemic allergic reactions including anaphylaxis), and benefits (significant improvement in nasal symptoms, seasonal flares of asthma) of immunotherapy with the patient. There is significant time commitment involved with allergy  shots, which includes weekly immunotherapy injections for first 9-12 months and then biweekly to monthly injections for 3-5 years.   Breathing Daily controller medication(s): continue Breo 100mcg 1 puff once a day and rinse mouth after each use.  May use Airsupra  rescue inhaler 2 puffs every 4 to 6 hours as needed for shortness of breath, chest tightness, coughing, and wheezing. Do not use more than 12 puffs in 24 hours. May use Airsupra  rescue inhaler 2 puffs 5 to 15 minutes prior to strenuous physical activities. Rinse mouth after each use.  Monitor frequency of use - if you need to use it more than  twice per week on a consistent basis let us  know.  Breathing control goals:  Full participation in all desired activities (may need albuterol  before activity) Albuterol  use two times or less a week on average (not counting use with activity) Cough interfering with sleep two times or less a month Oral steroids no more than once a year No hospitalizations   Follow up in 3 months or sooner if needed.  Reducing Pollen Exposure Pollen seasons: trees (spring), grass (summer) and ragweed/weeds (fall). Keep windows closed in your home and car to lower pollen exposure.  Install air conditioning in the bedroom and throughout the house if possible.  Avoid going out in dry windy days - especially early morning. Pollen counts are highest between 5 - 10 AM and on dry, hot and windy days.  Save outside activities for late afternoon or after a heavy rain, when pollen levels are lower.  Avoid mowing of grass if you have grass pollen allergy . Be aware that pollen can also be transported indoors on people and pets.  Dry your clothes in an automatic dryer rather than hanging them outside where they might collect pollen.  Rinse hair and eyes before bedtime. Control of House Dust Mite Allergen Dust mite allergens are a common trigger of allergy  and asthma symptoms. While they can be found throughout the house, these microscopic creatures thrive in warm, humid environments such as bedding, upholstered furniture and carpeting. Because so much time is spent in the bedroom, it is essential to reduce mite levels there.  Encase pillows, mattresses, and box springs in special allergen-proof fabric covers or airtight, zippered plastic covers.  Bedding should be washed weekly in hot water (130 F) and dried in a hot dryer. Allergen-proof covers are  available for comforters and pillows that can't be regularly washed.  Wash the allergy -proof covers every few months. Minimize clutter in the bedroom. Keep pets out of the  bedroom.  Keep humidity less than 50% by using a dehumidifier or air conditioning. You can buy a humidity measuring device called a hygrometer to monitor this.  If possible, replace carpets with hardwood, linoleum, or washable area rugs. If that's not possible, vacuum frequently with a vacuum that has a HEPA filter. Remove all upholstered furniture and non-washable window drapes from the bedroom. Remove all non-washable stuffed toys from the bedroom.  Wash stuffed toys weekly. Pet Allergen Avoidance: Contrary to popular opinion, there are no "hypoallergenic" breeds of dogs or cats. That is because people are not allergic to an animal's hair, but to an allergen found in the animal's saliva, dander (dead skin flakes) or urine. Pet allergy  symptoms typically occur within minutes. For some people, symptoms can build up and become most severe 8 to 12 hours after contact with the animal. People with severe allergies can experience reactions in public places if dander has been transported on the pet owners' clothing. Keeping an animal outdoors is only a partial solution, since homes with pets in the yard still have higher concentrations of animal allergens. Before getting a pet, ask your allergist to determine if you are allergic to animals. If your pet is already considered part of your family, try to minimize contact and keep the pet out of the bedroom and other rooms where you spend a great deal of time. As with dust mites, vacuum carpets often or replace carpet with a hardwood floor, tile or linoleum. High-efficiency particulate air (HEPA) cleaners can reduce allergen levels over time. While dander and saliva are the source of cat and dog allergens, urine is the source of allergens from rabbits, hamsters, mice and israel pigs; so ask a non-allergic family member to clean the animal's cage. If you have a pet allergy , talk to your allergist about the potential for allergy  immunotherapy (allergy  shots). This  strategy can often provide long-term relief.

## 2024-03-28 NOTE — ED Provider Notes (Signed)
 Behavioral Health Urgent Care Medical Screening Exam  Patient Name: Charlene Reed MRN: [id number] Date of Evaluation: 03/28/24 Chief Complaint:  My primary care diagnosed with depression and anxiety Diagnosis:  Final diagnoses:  Alcohol use disorder  GAD (generalized anxiety disorder)  Recurrent major depressive disorder, remission status unspecified (HCC)    History of Present illness: Charlene Reed is a 28 y.o. female.   Patient presented to [facility] as a walk in, voluntary, accompanied by fiance, who is  not present during interview with complaints of wanting to confirm prior mental health diagnosis of depression and anxiety. Patient is concerned that she may have mood disorder.    Charlene Reed Mater, 28 y.o., female patient seen face to face by this provider, consulted with Dr. Cole; and chart reviewed on 03/28/24.    On evaluation Charlene Reed reports that last year in October being diagnosed by her prior PCP with depression/anxiety and prescribed Zoloft  25 mg.  Reports since that time she has had significant improvement in her anxiety however continues to struggle on and off with depression that she thinks is the likely exacerbated by binge drinking.  She reports over a year she has gone on sporadic binges of drinking alcohol which is typically triggered by social occasions in which she takes 1 drink and continues to drink and requires anywhere from 1 to 2 weeks before she makes effort to completely discontinue alcohol.  She reports when his binge drinking episodes initially started a few years ago it would last 2 to 3 days but now she is having 1 to 2-week periods of drinking constantly all day and into the evening until she goes to sleep.  She reports her last drink was last Wednesday and reports some mild symptoms of withdrawal over the weekend but as of yesterday was able to get a good night sleep and denies any tremulousness or shakes or any other symptoms associated with alcohol  withdrawal.  She is interested in getting some outpatient treatment.  She reports she is currently looking for a job as she recently dropped graduate from college and feels college was a possible trigger for some of the excessive drinking due to stress although she reports she drinks when she is happy, upset or stressed it does not matter the situation she notes that when she starts drinking she does have severe difficulty stopping.  She endorses that she feels compulsive desire to continue to drink until she starts to feel bad which typically takes anywhere from a week to 2 weeks and that is her trigger to stop drinking.  Patient reports she has not been established with any outpatient psychiatric provider and that is why she feels that her mental health diagnoses could be an accurate.  Patient also has good insight into knowing that the abuse of alcohol is likely also contributing while also masking symptoms related to her underlying mental health disorder.  Patient also endorses extreme fluctuations in mood but also admits this is likely in the context of binge drinking and alcohol use.  Patient denies any family history of alcohol abuse although endorses that she did grow up with a stepfather who was dependent on alcohol.  Denies any diagnosed family history of mental health disorders.  Patient has had 1 psychiatric admission in October 2024 at [facility] after making suicidal statements in the context of being intoxicated with a BAL- 311.  Patient spent 3 days in the [facility] and was subsequently discharged to  continue with PCP follow-up and Zoloft  for MDD/GAD. Patient is interested in resources for intensive treatment for both alcohol use disorder, anxiety, and mild depression. She is interested medication management and therapy. Reports being established with an online therapist in the past but admits to not being serious about therapy and did not fully engage.  Patient endorses that she is  motivated to receive treatment at this time.  She is currently seeking employment and feels that right now she has the time to dedicate to an intensive therapy program to get mentally stable and to stabilize her alcohol addiction.  Patient denies any thoughts of suicide or homicidal ideations.  Patient denies any hallucinations, does not appear to be paranoid or delusional.  Patient is forward thinking as she is motivated to get help and endorses support of her fianc who is also there to support her and getting to mental health services that she needs.  Patient denies any safety concerns and has not demonstrated in behaviors or made any statements concerning for any contraindications to the patient being followed on an outpatient basis.  During evaluation Charlene Reed is sitting up right in no acute distress.  She is alert/oriented x 4; calm/cooperative; and mood congruent with affect.  She is speaking in a clear tone at moderate volume, and normal pace; with good eye contact.  Her thought process is coherent and relevant; There is no indication that she is currently responding to internal/external stimuli or experiencing delusional thought content; and she has denied suicidal/self-harm/homicidal ideation, psychosis, and paranoia.  Patient has remained calm throughout assessment and has answered questions appropriately.      At this time Charlene Reed is educated and verbalizes understanding of mental health resources and other crisis services in the community. She is instructed to call 911 and present to the nearest emergency room should she experience any suicidal/homicidal ideation, auditory/visual/hallucinations, or detrimental worsening of her mental health condition.  She was a also advised by Clinical research associate that she could call the toll-free phone on insurance card to assist with identifying in network counselors and agencies or number on back of Medicaid card to speak with care coordinator.  Patient  referred to [facility] Intensive Program Virtual Platform for treatment, by this Clinical research associate and confirmation email printed and provided to the patient.  Patient also provided contact information for for this agency.  Patient verbalized understanding and agreement with plan.    Flowsheet Row ED from 03/26/2024 in [facility] Emergency Department at [facility] from 09/18/2023 in [facility]gent Care at [facility] Admission (Discharged) [facility]  C-SSRS RISK CATEGORY No Risk No Risk Error: Q7 should not be populated when Q6 is No    Psychiatric Specialty Exam  Presentation  General Appearance:Appropriate for Environment  Eye Contact:Good  Speech:Clear and Coherent; Normal Rate  Speech Volume:Normal  Handedness:-- (not assessed)   Mood and Affect  Mood: Euthymic  Affect: Appropriate; Congruent   Thought Process  Thought Processes: Coherent; Goal Directed; Linear  Descriptions of Associations:Intact  Orientation:Full (Time, Place and Person)  Thought Content:Logical; WDL    Hallucinations:None  Ideas of Reference:None  Suicidal Thoughts:No  Homicidal Thoughts:No   Sensorium  Memory: Immediate Good; Recent Good; Remote Good  Judgment: Fair  Insight: Good   Executive Functions  Concentration: Good  Attention Span: Good  Recall: Good  Fund of Knowledge: Good  Language: Good   Psychomotor Activity  Psychomotor Activity: Normal  Assets  Assets: Manufacturing systems engineer; Desire for Improvement; Physical Health   Sleep  Sleep: Good  Number of hours:  6   Physical Exam: Physical Exam Constitutional:      General: She is not in acute distress.    Appearance: Normal appearance. She is not ill-appearing.  HENT:     Head: Normocephalic and atraumatic.     Nose: Nose normal.  Eyes:     Extraocular Movements: Extraocular movements intact.      Conjunctiva/sclera: Conjunctivae normal.     Pupils: Pupils are equal, round, and reactive to light.  Cardiovascular:     Rate and Rhythm: Normal rate and regular rhythm.  Pulmonary:     Effort: Pulmonary effort is normal.     Breath sounds: Normal breath sounds.  Musculoskeletal:     Cervical back: Normal range of motion and neck supple.  Skin:    General: Skin is warm and dry.  Neurological:     General: No focal deficit present.     Mental Status: She is alert and oriented to person, place, and time.     Review of Systems  Psychiatric/Behavioral:  Positive for depression (pertaining to be unable to stop drinking) and substance abuse (ETOH dependence). Negative for hallucinations, memory loss and suicidal ideas. The patient is nervous/anxious. The patient does not have insomnia.     Blood pressure 118/63, pulse 80, temperature 98 F (36.7 C), temperature source Oral, resp. rate 17, SpO2 98%. There is no height or weight on file to calculate BMI.  Musculoskeletal: Strength & Muscle Tone: within normal limits Gait & Station: normal Patient leans: N/A   [facility] MSE Discharge Disposition for Follow up and Recommendations: Based on my evaluation the patient does not appear to have an emergency medical condition and can be discharged with resources and follow up care in outpatient services for   Patient referred to [facility] Intensive Program Virtual Platform for treatment, by this Clinical research associate and confirmation email printed and provided to the patient.  Patient also provided contact information for for this agency.  Patient verbalized understanding and agreement with plan.     Suzen Lesches, NP 03/28/2024, 12:39 PM

## 2024-03-28 NOTE — Discharge Instructions (Addendum)
 Charlene Reed

## 2024-03-29 ENCOUNTER — Encounter (HOSPITAL_COMMUNITY): Payer: Self-pay | Admitting: Family Medicine

## 2024-03-29 LAB — URINE CULTURE: Culture: 10000 — AB

## 2024-04-11 ENCOUNTER — Telehealth: Payer: Self-pay | Admitting: Allergy

## 2024-04-11 ENCOUNTER — Telehealth: Admitting: Physician Assistant

## 2024-04-11 ENCOUNTER — Encounter: Payer: Self-pay | Admitting: Allergy

## 2024-04-11 DIAGNOSIS — J4540 Moderate persistent asthma, uncomplicated: Secondary | ICD-10-CM

## 2024-04-11 MED ORDER — FLUTICASONE-SALMETEROL 100-50 MCG/ACT IN AEPB
1.0000 | INHALATION_SPRAY | Freq: Two times a day (BID) | RESPIRATORY_TRACT | 3 refills | Status: AC
Start: 2024-04-11 — End: ?

## 2024-04-11 NOTE — Progress Notes (Signed)
 Virtual Visit Consent   Charlene Reed, you are scheduled for a virtual visit with a [facility] provider today. Just as with appointments in the office, your consent must be obtained to participate. Your consent will be active for this visit and any virtual visit you may have with one of our providers in the next 365 days. If you have a MyChart account, a copy of this consent can be sent to you electronically.  As this is a virtual visit, video technology does not allow for your provider to perform a traditional examination. This may limit your provider's ability to fully assess your condition. If your provider identifies any concerns that need to be evaluated in person or the need to arrange testing (such as labs, EKG, etc.), we will make arrangements to do so. Although advances in technology are sophisticated, we cannot ensure that it will always work on either your end or our end. If the connection with a video visit is poor, the visit may have to be switched to a telephone visit. With either a video or telephone visit, we are not always able to ensure that we have a secure connection.  By engaging in this virtual visit, you consent to the provision of healthcare and authorize for your insurance to be billed (if applicable) for the services provided during this visit. Depending on your insurance coverage, you may receive a charge related to this service.  I need to obtain your verbal consent now. Are you willing to proceed with your visit today? Charlene Reed has provided verbal consent on 04/11/2024 for a virtual visit (video or telephone). Delon [name], PA-C  Date: 04/11/2024 4:11 PM   Virtual Visit via Video Note   I, Delon [name], connected with  Charlene Reed  ([id number], July 01, 1996) on 04/11/24 at  4:00 PM EDT by a video-enabled telemedicine application and verified that I am speaking with the correct person using two identifiers.  Location: Patient: Virtual Visit Location  Patient: Home Provider: Virtual Visit Location Provider: Home Office   I discussed the limitations of evaluation and management by telemedicine and the availability of in person appointments. The patient expressed understanding and agreed to proceed.    History of Present Illness: Charlene Reed is a 28 y.o. who identifies as a female who was assigned female at birth, and is being seen today for medication refill. Recently had lost her [employer] and has had to go on [place] for a short period of time. She used her last puff of Breo today and is requesting a refill. Has not tried any other medications. Is followed by Dr. Luke with [facility] .   Problems:  Patient Active Problem List   Diagnosis Date Noted   Generalized anxiety disorder 10/14/2023   Adjustment disorder with mixed anxiety and depressed mood 07/02/2023   Elevated LFTs 05/26/2023   Systolic murmur 05/26/2023   Anxiety 05/26/2023   Performance anxiety 05/26/2023   Seasonal allergic rhinitis 06/06/2022   Acne vulgaris 06/11/2018   Family history of sudden cardiac death in father 04-Nov-2012    Allergies: No Known Allergies Medications:  Current Outpatient Medications:    Albuterol -Budesonide (AIRSUPRA ) 90-80 MCG/ACT AERO, Inhale 2 puffs into the lungs every 4 (four) hours as needed (coughing, wheezing, chest tightness). Do not exceed 12 puffs in 24 hours., Disp: 10.7 g, Rfl: 2   fexofenadine (ALLEGRA) 180 MG tablet, Take 180 mg by mouth daily., Disp: , Rfl:    fluticasone  (FLONASE )  50 MCG/ACT nasal spray, Place 1-2 sprays into both nostrils daily as needed (nasal congestion)., Disp: 16 g, Rfl: 5   fluticasone -salmeterol (ADVAIR) 100-50 MCG/ACT AEPB, Inhale 1 puff into the lungs 2 (two) times daily., Disp: 1 each, Rfl: 3   Olopatadine  HCl 0.2 % SOLN, Apply 1 drop to eye daily as needed (itchy/watery eyes)., Disp: 2.5 mL, Rfl: 5   sertraline  (ZOLOFT ) 100 MG tablet, Take 1 tablet (100 mg total) by mouth at  bedtime., Disp: 90 tablet, Rfl: 0   tretinoin (RETIN-A) 0.025 % cream, Apply 1 Application topically at bedtime., Disp: , Rfl:   Observations/Objective: Patient is well-developed, well-nourished in no acute distress.  Resting comfortably at home.  Head is normocephalic, atraumatic.  No labored breathing.  Speech is clear and coherent with logical content.  Patient is alert and oriented at baseline.    Assessment and Plan: 1. Moderate persistent asthma without complication (Primary) - fluticasone -salmeterol (ADVAIR) 100-50 MCG/ACT AEPB; Inhale 1 puff into the lungs 2 (two) times daily.  Dispense: 1 each; Refill: 3  - Since has had a change of insurance and Breo being a non-preferred drug with Medicaid and not having tried other medications, we will change to Advair, which is the preferred drug.  - Advised if not responding as well to Advair she can follow back up to have changed to [facility] - Keep scheduled appointments with new PCP in Sept 2025 and with Dr. Luke in 06/2024  Follow Up Instructions: I discussed the assessment and treatment plan with the patient. The patient was provided an opportunity to ask questions and all were answered. The patient agreed with the plan and demonstrated an understanding of the instructions.  A copy of instructions were sent to the patient via MyChart unless otherwise noted below.    The patient was advised to call back or seek an in-person evaluation if the symptoms worsen or if the condition fails to improve as anticipated.    Delon [name], PA-C

## 2024-04-11 NOTE — Telephone Encounter (Signed)
 Pt called and stated she had to change insurances, and is using Medicaid until her new insurance kicks in. She states that the pharmacy will not fill her prescription for fluticasone  furoate-vilanterol (BREO ELLIPTA ) 100-25 MCG/ACT AEPB [[id number]] until it is sent back in. [facility] in [place].

## 2024-04-11 NOTE — Patient Instructions (Signed)
 Charlene Reed, thank you for joining Delon [name], PA-C for today's virtual visit.  While this provider is not your primary care provider (PCP), if your PCP is located in our provider database this encounter information will be shared with them immediately following your visit.   A [facility] MyChart account gives you access to today's visit and all your visits, tests, and labs performed at [facility]  click here if you don't have a [facility] MyChart account or go to [email or web address]  Consent: (Patient) Charlene Reed provided verbal consent for this virtual visit at the beginning of the encounter.  Current Medications:  Current Outpatient Medications:    Albuterol -Budesonide (AIRSUPRA ) 90-80 MCG/ACT AERO, Inhale 2 puffs into the lungs every 4 (four) hours as needed (coughing, wheezing, chest tightness). Do not exceed 12 puffs in 24 hours., Disp: 10.7 g, Rfl: 2   fexofenadine (ALLEGRA) 180 MG tablet, Take 180 mg by mouth daily., Disp: , Rfl:    fluticasone  (FLONASE ) 50 MCG/ACT nasal spray, Place 1-2 sprays into both nostrils daily as needed (nasal congestion)., Disp: 16 g, Rfl: 5   fluticasone -salmeterol (ADVAIR) 100-50 MCG/ACT AEPB, Inhale 1 puff into the lungs 2 (two) times daily., Disp: 1 each, Rfl: 3   Olopatadine  HCl 0.2 % SOLN, Apply 1 drop to eye daily as needed (itchy/watery eyes)., Disp: 2.5 mL, Rfl: 5   sertraline  (ZOLOFT ) 100 MG tablet, Take 1 tablet (100 mg total) by mouth at bedtime., Disp: 90 tablet, Rfl: 0   tretinoin (RETIN-A) 0.025 % cream, Apply 1 Application topically at bedtime., Disp: , Rfl:    Medications ordered in this encounter:  Meds ordered this encounter  Medications   fluticasone -salmeterol (ADVAIR) 100-50 MCG/ACT AEPB    Sig: Inhale 1 puff into the lungs 2 (two) times daily.    Dispense:  1 each    Refill:  3    Supervising Provider:   BLAISE ALEENE KIDD [phone]     *If you need refills on other medications prior to your  next appointment, please contact your pharmacy*  Follow-Up: Call back or seek an in-person evaluation if the symptoms worsen or if the condition fails to improve as anticipated.  [facility] [phone]  Other Instructions  Fluticasone ; Salmeterol Dry Powder Inhaler (DPI) What is this medication? FLUTICASONE ; SALMETEROL (floo TIK a sone; sal ME te role) treats asthma and chronic obstructive pulmonary disease (COPD). It works by opening the airways of the lungs, making it easier to breathe. It is a combination of an inhaled steroid and a bronchodilator. It is often called a controller inhaler. Do not use it to treat a sudden asthma attack or COPD flare-up. This medicine may be used for other purposes; ask your health care provider or pharmacist if you have questions. COMMON BRAND NAME(S): Advair, AirDuo Digihaler, Airduo RespiClick What should I tell my care team before I take this medication? They need to know if you have any of these conditions: Diabetes Eye disease, such as glaucoma, cataracts, or blurred vision Heart disease High blood pressure Immune system problems Infection, such as tuberculosis (TB) or other bacterial, fungal, or viral infections Irregular heartbeat or rhythm Liver disease Osteoporosis, weak bones Seizures Taking other steroids, such as dexamethasone  or prednisone  Thyroid  disease An unusual or allergic reaction to fluticasone , salmeterol, other medications, foods, dyes, or preservatives Pregnant or trying to get pregnant Breastfeeding How should I use this medication? This medication is inhaled through the mouth. Rinse your mouth with water after  use. Make sure not to swallow the water. Take it as directed on the prescription label at the same time every day. Do not use it more often than directed. This medication comes with INSTRUCTIONS FOR USE. Ask your pharmacist for directions on how to use this medication. Read the information carefully.  Talk to your pharmacist or care team if you have questions. Talk to your care team about the use of this medication in children. While it may be prescribed for children as young as 4 years for selected conditions, precautions do apply. Overdosage: If you think you have taken too much of this medicine contact a poison control center or emergency room at once. NOTE: This medicine is only for you. Do not share this medicine with others. What if I miss a dose? If you miss a dose, use it as soon as you can. If it is almost time for your next dose, use only that dose. Do not use double or extra doses. What may interact with this medication? Do not take this medication with any of the following: MAOIs, such as Carbex, Eldepryl, Marplan, Nardil, and Parnate This medication may also interact with the following: Aminophylline or theophylline Antivirals for HIV or AIDS Beta blockers, such as metoprolol or propranolol  Certain antibiotics, such as clarithromycin, erythromycin, levofloxacin, linezolid, and telithromycin Certain medications for fungal infections, such as ketoconazole, itraconazole, posaconazole, voriconazole Conivaptan Diuretics Medications for colds Medications for depression or mental health conditions Nefazodone Vaccines This list may not describe all possible interactions. Give your health care provider a list of all the medicines, herbs, non-prescription drugs, or dietary supplements you use. Also tell them if you smoke, drink alcohol, or use illegal drugs. Some items may interact with your medicine. What should I watch for while using this medication? Visit your care team for regular checks on your progress. Tell your care team if your symptoms do not start to get better or if they get worse. Talk to your care team about how to treat an acute asthma attack or bronchospasm (wheezing). Be sure to always have a short-acting inhaler with you. If you use your short-acting inhaler and your  symptoms do not get better or if they get worse, call your care team right away. If you have asthma, you and your care team should develop an Asthma Action Plan that is just for you. Be sure to know what to do if you are in the yellow (asthma is getting worse) or red (medical alert) zones. This medication can worsen breathing or cause wheezing right after you use it. Be sure you have a short-acting inhaler for acute attacks (wheezing) nearby. If this happens, stop using this medication right away and call your care team. This medication may increase your risk of dying from asthma-related problems. Talk to your care team if you have questions. This medication may increase your risk of getting an infection. Call your care team for advice if you get a fever, chills, sore throat, or other symptoms of a cold or flu. Do not treat yourself. Try to avoid being around people who are sick. If you have not had the measles or chickenpox vaccines, tell your care team right away if you are around someone with these viruses. This medication may slow your child's growth if it is taken for a long time at high doses. Your care team will monitor your child's growth. Using this medication for a long time may weaken your bones. The risk of bone fractures may be  increased. Talk to your care team about your bone health. This medication may increase blood sugar. Ask your care team if changes in diet or medications are needed if you have diabetes. Do not treat yourself for coughs, colds or allergies without asking your care team for advice. Some nonprescription medications can affect this one. What side effects may I notice from receiving this medication? Side effects that you should report to your care team as soon as possible: Allergic reactions--skin rash, itching, hives, swelling of the face, lips, tongue, or throat Flu-like symptoms--fever, chills, muscle pain, cough, headache, fatigue Heart rhythm changes--fast or  irregular heartbeat, dizziness, feeling faint or lightheaded, chest pain, trouble breathing Increase in blood pressure Low adrenal gland function--nausea, vomiting, loss of appetite, unusual weakness or fatigue, dizziness Muscle pain or cramps Pain, tingling, or numbness in the hands or feet Sinus pain or pressure around the face or forehead Thrush--white patches in the mouth Wheezing or trouble breathing that is worse after use Side effects that usually do not require medical attention (report to your care team if they continue or are bothersome): Change in taste Cough Dry mouth Headache Hoarseness Sore throat Tremors or shaking Trouble sleeping This list may not describe all possible side effects. Call your doctor for medical advice about side effects. You may report side effects to FDA at 1-800-FDA-1088. Where should I keep my medication? Keep out of the reach of children and pets. Store at room temperature between 20 and 25 degrees C (68 and 77 degrees F). Keep inhaler away from extreme heat or humidity. Get rid of it 1 month after removing it from the foil pouch, when the dose counter reads 0 or after the expiration date, whichever is first. NOTE: This sheet is a summary. It may not cover all possible information. If you have questions about this medicine, talk to your doctor, pharmacist, or health care provider.  2025 Elsevier/Gold Standard (2023-08-11 00:00:00)   If you have been instructed to have an in-person evaluation today at a local Urgent Care facility, please use the link below. It will take you to a list of all of our available [facility], including address, phone number and hours of operation. Please do not delay care.  [facility]  If you or a family member do not have a primary care provider, use the link below to schedule a visit and establish care. When you choose a [facility] primary care physician or advanced practice provider, you  gain a long-term partner in health. Find a Primary Care Provider  Learn more about [facility] in-office and virtual care options:  - Get Care Now

## 2024-04-12 ENCOUNTER — Encounter (INDEPENDENT_AMBULATORY_CARE_PROVIDER_SITE_OTHER): Payer: Self-pay

## 2024-04-12 ENCOUNTER — Telehealth: Payer: Self-pay

## 2024-04-12 NOTE — Telephone Encounter (Signed)
 Spoke with the patient and let her know she needed to request a cancellation/termination of coverage form from [place] in order medicaid to cover Breo. Was seen by Urgent Care yesterday and was prescribed Advair until insurance could be resolved. She will reach back out once this issue is handled for [facility] prescription. Verbalized understanding.

## 2024-04-15 MED ORDER — FLUTICASONE FUROATE-VILANTEROL 100-25 MCG/ACT IN AEPB: INHALATION_SPRAY | RESPIRATORY_TRACT | 5 refills | Status: AC

## 2024-04-18 ENCOUNTER — Other Ambulatory Visit (HOSPITAL_COMMUNITY): Payer: Self-pay

## 2024-04-18 ENCOUNTER — Telehealth: Payer: Self-pay

## 2024-04-18 NOTE — Telephone Encounter (Signed)
*  AA  Pharmacy Patient Advocate Encounter   Received notification from CoverMyMeds that prior authorization for Fluticasone  Furoate-Vilanterol 100-25MCG/ACT aerosol powder  is required/requested.   Insurance verification completed.   The patient is insured through [facility] .   Per test claim: PA required; PA started via CoverMyMeds. KEY [id number] . Please see clinical question(s) below that I am not finding the answer to in their chart and advise.   Patient recently filled Advair Diskus, which medication is patient taking?

## 2024-05-17 ENCOUNTER — Other Ambulatory Visit: Payer: Self-pay | Admitting: Family Medicine

## 2024-05-17 ENCOUNTER — Other Ambulatory Visit (HOSPITAL_COMMUNITY): Admission: RE | Admit: 2024-05-17 | Source: Ambulatory Visit

## 2024-05-17 ENCOUNTER — Other Ambulatory Visit (HOSPITAL_COMMUNITY)
Admission: RE | Admit: 2024-05-17 | Discharge: 2024-05-17 | Disposition: A | Discharge status: Home / Self Care | Source: Ambulatory Visit | Attending: Family Medicine | Admitting: Family Medicine

## 2024-05-17 DIAGNOSIS — Z124 Encounter for screening for malignant neoplasm of cervix: Secondary | ICD-10-CM | POA: Diagnosis present

## 2024-05-23 LAB — MOLECULAR ANCILLARY ONLY
Bacterial Vaginitis (gardnerella): POSITIVE — AB
Candida Glabrata: NEGATIVE
Candida Vaginitis: NEGATIVE
Chlamydia: NEGATIVE
Comment: NEGATIVE
Comment: NEGATIVE
Comment: NEGATIVE
Comment: NEGATIVE
Comment: NEGATIVE
Comment: NORMAL
Neisseria Gonorrhea: NEGATIVE
Trichomonas: NEGATIVE

## 2024-05-24 LAB — CYTOLOGY - PAP
Adequacy: ABSENT
Diagnosis: NEGATIVE

## 2024-06-26 NOTE — Progress Notes (Deleted)
 Follow Up Note  RE: Charlene Reed MRN: [id number] DOB: [birth year] Date of Office Visit: 06/27/2024  Referring provider: No ref. provider found Primary care provider: Pcp, No  Chief Complaint: No chief complaint on file.  History of Present Illness: I had the pleasure of seeing Charlene Reed for a follow up visit at [facility] on 06/27/2024. She is a 28 y.o. female, who is being followed for allergic rhinoconjunctivitis and asthma. Her previous allergy  office visit was on 03/28/2024 with Dr. Luke. Today is a regular follow up visit.  Discussed the use of AI scribe software for clinical note transcription with the patient, who gave verbal consent to proceed.  History of Present Illness            ***  Assessment and Plan: Charlene Reed is a 28 y.o. female with: Seasonal allergic rhinitis due to pollen Allergic rhinitis due to animal dander Allergic rhinitis due to dust mite Allergic conjunctivitis of both eyes Past history - Symptoms managed with Allegra, but not effective for wheezing.  Today's skin testing positive to grass, weed pollen, trees, dust mites, cat, dog.  Start environmental control measures as below. Use over the counter antihistamines such as Zyrtec (cetirizine), Claritin (loratadine), Allegra (fexofenadine), or Xyzal (levocetirizine) daily as needed. May take twice a day during allergy  flares. May switch antihistamines every few months. Use Flonase  (fluticasone ) nasal spray 1-2 sprays per nostril once a day as needed for nasal congestion.  Nasal saline spray (i.e., Simply Saline) or nasal saline lavage (i.e., NeilMed) is recommended as needed and prior to medicated nasal sprays. Use olopatadine  eye drops 0.2% once a day as needed for itchy/watery eyes. Recommend allergy  injections. 2 shots. Sometimes I have PCP give injections.  Had a detailed discussion with patient/family that clinical history is suggestive of allergic rhinitis, and may  benefit from allergy  immunotherapy (AIT). Discussed in detail regarding the dosing, schedule, side effects (mild to moderate local allergic reaction and rarely systemic allergic reactions including anaphylaxis), and benefits (significant improvement in nasal symptoms, seasonal flares of asthma) of immunotherapy with the patient. There is significant time commitment involved with allergy  shots, which includes weekly immunotherapy injections for first 9-12 months and then biweekly to monthly injections for 3-5 years.    Mild persistent asthma without complication - newly diagnosed. Past history - Daily symptoms and using albuterol  prn with good benefit. 3 courses of prednisone  within the past 12 months. 2025 spirometry showed no overt abnormalities noted given today's efforts with 9% and 230cc improvement in FEV1 post bronchodilator treatment. Clinically feeling improved.  Daily controller medication(s): continue Breo 100mcg 1 puff once a day and rinse mouth after each use.  May use Airsupra  rescue inhaler 2 puffs every 4 to 6 hours as needed for shortness of breath, chest tightness, coughing, and wheezing. Do not use more than 12 puffs in 24 hours. May use Airsupra  rescue inhaler 2 puffs 5 to 15 minutes prior to strenuous physical activities. Rinse mouth after each use.  Monitor frequency of use - if you need to use it more than twice per week on a consistent basis let us  know.    Assessment and Plan              No follow-ups on file.  No orders of the defined types were placed in this encounter.  Lab Orders  No laboratory test(s) ordered today    Diagnostics: Spirometry:  Tracings reviewed. Her effort: {Blank single:19197::Good reproducible efforts.,It was  hard to get consistent efforts and there is a question as to whether this reflects a maximal maneuver.,Poor effort, data can not be interpreted.} FVC: ***L FEV1: ***L, ***% predicted FEV1/FVC ratio: ***% Interpretation: {Blank  single:19197::Spirometry consistent with mild obstructive disease,Spirometry consistent with moderate obstructive disease,Spirometry consistent with severe obstructive disease,Spirometry consistent with possible restrictive disease,Spirometry consistent with mixed obstructive and restrictive disease,Spirometry uninterpretable due to technique,Spirometry consistent with normal pattern,No overt abnormalities noted given today's efforts}.  Please see scanned spirometry results for details.  Skin Testing: {Blank single:19197::Select foods,Environmental allergy  panel,Environmental allergy  panel and select foods,Food allergy  panel,None,Deferred due to recent antihistamines use}. *** Results discussed with patient/family.   Medication List:  Current Outpatient Medications  Medication Sig Dispense Refill   Albuterol -Budesonide (AIRSUPRA ) 90-80 MCG/ACT AERO Inhale 2 puffs into the lungs every 4 (four) hours as needed (coughing, wheezing, chest tightness). Do not exceed 12 puffs in 24 hours. 10.7 g 2   fexofenadine (ALLEGRA) 180 MG tablet Take 180 mg by mouth daily.     fluticasone  (FLONASE ) 50 MCG/ACT nasal spray Place 1-2 sprays into both nostrils daily as needed (nasal congestion). 16 g 5   fluticasone  furoate-vilanterol (BREO ELLIPTA ) 100-25 MCG/ACT AEPB 1 puff once a day and rinse mouth after each use. 60 each 5   fluticasone -salmeterol (ADVAIR) 100-50 MCG/ACT AEPB Inhale 1 puff into the lungs 2 (two) times daily. 1 each 3   Olopatadine  HCl 0.2 % SOLN Apply 1 drop to eye daily as needed (itchy/watery eyes). 2.5 mL 5   sertraline  (ZOLOFT ) 100 MG tablet Take 1 tablet (100 mg total) by mouth at bedtime. 90 tablet 0   tretinoin (RETIN-A) 0.025 % cream Apply 1 Application topically at bedtime.     No current facility-administered medications for this visit.   Allergies: No Known Allergies I reviewed her past medical history, social history, family history, and environmental  history and no significant changes have been reported from her previous visit.  Review of Systems  Constitutional:  Negative for appetite change, chills, fever and unexpected weight change.  HENT:  Positive for postnasal drip, rhinorrhea and sneezing. Negative for congestion.   Eyes:  Positive for itching.  Respiratory:  Positive for cough, chest tightness, shortness of breath and wheezing.   Cardiovascular:  Negative for chest pain.  Gastrointestinal:  Negative for abdominal pain.  Genitourinary:  Negative for difficulty urinating.  Skin:  Negative for rash.  Allergic/Immunologic: Positive for environmental allergies.  Neurological:  Negative for headaches.    Objective: There were no vitals taken for this visit. There is no height or weight on file to calculate BMI. Physical Exam Vitals and nursing note reviewed.  Constitutional:      Appearance: Normal appearance. She is well-developed.  HENT:     Head: Normocephalic and atraumatic.     Right Ear: Tympanic membrane and external ear normal.     Left Ear: Tympanic membrane and external ear normal.     Nose: Nose normal.     Mouth/Throat:     Mouth: Mucous membranes are moist.     Pharynx: Oropharynx is clear.  Eyes:     Conjunctiva/sclera: Conjunctivae normal.  Cardiovascular:     Rate and Rhythm: Normal rate and regular rhythm.     Heart sounds: Normal heart sounds. No murmur heard.    No friction rub. No gallop.  Pulmonary:     Effort: Pulmonary effort is normal.     Breath sounds: Normal breath sounds. No wheezing, rhonchi or rales.  Musculoskeletal:  Cervical back: Neck supple.  Skin:    General: Skin is warm.     Findings: No rash.  Neurological:     Mental Status: She is alert and oriented to person, place, and time.  Psychiatric:        Behavior: Behavior normal.    Previous notes and tests were reviewed. The plan was reviewed with the patient/family, and all questions/concerned were addressed.  It was  my pleasure to see Charlene Reed today and participate in her care. Please feel free to contact me with any questions or concerns.  Sincerely,  Orlan Cramp, DO Allergy  & Immunology  [facility]  [place] office: [phone] [facility] office: [phone]

## 2024-06-27 ENCOUNTER — Ambulatory Visit: Admitting: Allergy

## 2024-06-27 DIAGNOSIS — J3081 Allergic rhinitis due to animal (cat) (dog) hair and dander: Secondary | ICD-10-CM

## 2024-06-27 DIAGNOSIS — J301 Allergic rhinitis due to pollen: Secondary | ICD-10-CM

## 2024-06-27 DIAGNOSIS — J3089 Other allergic rhinitis: Secondary | ICD-10-CM

## 2024-06-27 DIAGNOSIS — J4530 Mild persistent asthma, uncomplicated: Secondary | ICD-10-CM

## 2024-06-27 DIAGNOSIS — H1013 Acute atopic conjunctivitis, bilateral: Secondary | ICD-10-CM

## 2024-08-03 ENCOUNTER — Other Ambulatory Visit: Payer: Self-pay | Admitting: Family Medicine

## 2024-08-03 DIAGNOSIS — R102 Pelvic and perineal pain unspecified side: Secondary | ICD-10-CM

## 2024-08-05 ENCOUNTER — Inpatient Hospital Stay: Admission: RE | Admit: 2024-08-05 | Discharge: 2024-08-05 | Attending: Family Medicine | Admitting: Family Medicine

## 2024-08-05 DIAGNOSIS — R102 Pelvic and perineal pain unspecified side: Secondary | ICD-10-CM

## 2024-08-16 ENCOUNTER — Other Ambulatory Visit: Payer: Self-pay | Admitting: Family Medicine

## 2024-08-16 DIAGNOSIS — N939 Abnormal uterine and vaginal bleeding, unspecified: Secondary | ICD-10-CM

## 2024-08-16 DIAGNOSIS — R109 Unspecified abdominal pain: Secondary | ICD-10-CM
